# Patient Record
Sex: Female | Born: 1945
Health system: Southern US, Community
[De-identification: ages and names within clinical notes are randomized; demographics above are authoritative.]

## PROBLEM LIST (undated history)

## (undated) DIAGNOSIS — M1990 Unspecified osteoarthritis, unspecified site: Secondary | ICD-10-CM

## (undated) DIAGNOSIS — N189 Chronic kidney disease, unspecified: Secondary | ICD-10-CM

## (undated) DIAGNOSIS — M810 Age-related osteoporosis without current pathological fracture: Secondary | ICD-10-CM

## (undated) DIAGNOSIS — I499 Cardiac arrhythmia, unspecified: Secondary | ICD-10-CM

## (undated) DIAGNOSIS — A048 Other specified bacterial intestinal infections: Secondary | ICD-10-CM

## (undated) DIAGNOSIS — E7800 Pure hypercholesterolemia, unspecified: Secondary | ICD-10-CM

## (undated) DIAGNOSIS — T753XXA Motion sickness, initial encounter: Secondary | ICD-10-CM

## (undated) DIAGNOSIS — L719 Rosacea, unspecified: Secondary | ICD-10-CM

## (undated) DIAGNOSIS — K219 Gastro-esophageal reflux disease without esophagitis: Secondary | ICD-10-CM

## (undated) DIAGNOSIS — R7302 Impaired glucose tolerance (oral): Secondary | ICD-10-CM

## (undated) DIAGNOSIS — Z972 Presence of dental prosthetic device (complete) (partial): Secondary | ICD-10-CM

## (undated) HISTORY — PX: ABDOMINAL HYSTERECTOMY: SHX81

## (undated) HISTORY — PX: BILATERAL CARPAL TUNNEL RELEASE: SHX6508

## (undated) HISTORY — PX: EYE SURGERY: SHX253

## (undated) HISTORY — PX: CHOLECYSTECTOMY: SHX55

---

## 2004-07-03 ENCOUNTER — Ambulatory Visit: Payer: Self-pay | Admitting: Gastroenterology

## 2005-02-24 ENCOUNTER — Ambulatory Visit: Payer: Self-pay | Admitting: Unknown Physician Specialty

## 2006-04-17 ENCOUNTER — Ambulatory Visit: Payer: Self-pay | Admitting: Unknown Physician Specialty

## 2007-04-29 ENCOUNTER — Ambulatory Visit: Payer: Self-pay | Admitting: Unknown Physician Specialty

## 2008-05-18 ENCOUNTER — Ambulatory Visit: Payer: Self-pay | Admitting: Unknown Physician Specialty

## 2009-05-25 ENCOUNTER — Ambulatory Visit: Payer: Self-pay | Admitting: Unknown Physician Specialty

## 2010-05-31 ENCOUNTER — Ambulatory Visit: Payer: Self-pay | Admitting: Unknown Physician Specialty

## 2010-06-04 ENCOUNTER — Ambulatory Visit: Payer: Self-pay | Admitting: Gastroenterology

## 2011-06-03 ENCOUNTER — Ambulatory Visit: Payer: Self-pay | Admitting: Unknown Physician Specialty

## 2012-06-03 ENCOUNTER — Ambulatory Visit: Payer: Self-pay | Admitting: Unknown Physician Specialty

## 2013-06-23 ENCOUNTER — Ambulatory Visit: Payer: Self-pay | Admitting: Internal Medicine

## 2014-08-02 ENCOUNTER — Ambulatory Visit: Payer: Self-pay | Admitting: Internal Medicine

## 2015-08-06 ENCOUNTER — Other Ambulatory Visit: Payer: Self-pay | Admitting: Internal Medicine

## 2015-08-06 DIAGNOSIS — Z1239 Encounter for other screening for malignant neoplasm of breast: Secondary | ICD-10-CM

## 2015-08-16 ENCOUNTER — Ambulatory Visit
Admission: RE | Admit: 2015-08-16 | Discharge: 2015-08-16 | Disposition: A | Discharge status: Home / Self Care | Payer: PPO | Source: Ambulatory Visit | Attending: Internal Medicine | Admitting: Internal Medicine

## 2015-08-16 DIAGNOSIS — Z1231 Encounter for screening mammogram for malignant neoplasm of breast: Secondary | ICD-10-CM | POA: Diagnosis not present

## 2015-08-16 DIAGNOSIS — Z1239 Encounter for other screening for malignant neoplasm of breast: Secondary | ICD-10-CM

## 2015-09-17 ENCOUNTER — Encounter: Payer: Self-pay | Admitting: *Deleted

## 2015-09-24 NOTE — Discharge Instructions (Signed)

## 2015-09-25 ENCOUNTER — Ambulatory Visit: Payer: PPO | Admitting: Anesthesiology

## 2015-09-25 ENCOUNTER — Encounter
Admission: RE | Disposition: A | Discharge status: Home / Self Care | Payer: Self-pay | Source: Ambulatory Visit | Attending: Gastroenterology

## 2015-09-25 ENCOUNTER — Ambulatory Visit
Admission: RE | Admit: 2015-09-25 | Discharge: 2015-09-25 | Disposition: A | Discharge status: Home / Self Care | Payer: PPO | Source: Ambulatory Visit | Attending: Gastroenterology | Admitting: Gastroenterology

## 2015-09-25 DIAGNOSIS — M1990 Unspecified osteoarthritis, unspecified site: Secondary | ICD-10-CM | POA: Insufficient documentation

## 2015-09-25 DIAGNOSIS — Z8371 Family history of colonic polyps: Secondary | ICD-10-CM | POA: Insufficient documentation

## 2015-09-25 DIAGNOSIS — Z87891 Personal history of nicotine dependence: Secondary | ICD-10-CM | POA: Diagnosis not present

## 2015-09-25 DIAGNOSIS — Z90710 Acquired absence of both cervix and uterus: Secondary | ICD-10-CM | POA: Insufficient documentation

## 2015-09-25 DIAGNOSIS — E7800 Pure hypercholesterolemia, unspecified: Secondary | ICD-10-CM | POA: Insufficient documentation

## 2015-09-25 DIAGNOSIS — Z1211 Encounter for screening for malignant neoplasm of colon: Secondary | ICD-10-CM | POA: Diagnosis not present

## 2015-09-25 DIAGNOSIS — Z7982 Long term (current) use of aspirin: Secondary | ICD-10-CM | POA: Insufficient documentation

## 2015-09-25 DIAGNOSIS — Z800 Family history of malignant neoplasm of digestive organs: Secondary | ICD-10-CM | POA: Insufficient documentation

## 2015-09-25 DIAGNOSIS — M810 Age-related osteoporosis without current pathological fracture: Secondary | ICD-10-CM | POA: Insufficient documentation

## 2015-09-25 DIAGNOSIS — E785 Hyperlipidemia, unspecified: Secondary | ICD-10-CM | POA: Diagnosis not present

## 2015-09-25 DIAGNOSIS — Z9049 Acquired absence of other specified parts of digestive tract: Secondary | ICD-10-CM | POA: Diagnosis not present

## 2015-09-25 HISTORY — DX: Presence of dental prosthetic device (complete) (partial): Z97.2

## 2015-09-25 HISTORY — DX: Motion sickness, initial encounter: T75.3XXA

## 2015-09-25 HISTORY — DX: Unspecified osteoarthritis, unspecified site: M19.90

## 2015-09-25 HISTORY — DX: Pure hypercholesterolemia, unspecified: E78.00

## 2015-09-25 HISTORY — PX: COLONOSCOPY: SHX5424

## 2015-09-25 SURGERY — COLONOSCOPY
Anesthesia: Monitor Anesthesia Care | Wound class: Contaminated

## 2015-09-25 MED ORDER — OXYCODONE HCL 5 MG/5ML PO SOLN: 5.0000 mg | Freq: Once | ORAL | Status: DC | PRN

## 2015-09-25 MED ORDER — DEXAMETHASONE SODIUM PHOSPHATE 4 MG/ML IJ SOLN: 8.0000 mg | Freq: Once | INTRAMUSCULAR | Status: DC | PRN

## 2015-09-25 MED ORDER — ACETAMINOPHEN 325 MG PO TABS: 325.0000 mg | ORAL_TABLET | ORAL | Status: DC | PRN

## 2015-09-25 MED ORDER — SODIUM CHLORIDE 0.9 % IV SOLN: INTRAVENOUS | Status: DC

## 2015-09-25 MED ORDER — LACTATED RINGERS IV SOLN: 500.0000 mL | INTRAVENOUS | Status: DC

## 2015-09-25 MED ORDER — PROPOFOL 10 MG/ML IV BOLUS
INTRAVENOUS | Status: DC | PRN
  Administered 2015-09-25 (×5): 50 mg via INTRAVENOUS

## 2015-09-25 MED ORDER — ACETAMINOPHEN 160 MG/5ML PO SOLN: 325.0000 mg | ORAL | Status: DC | PRN

## 2015-09-25 MED ORDER — LACTATED RINGERS IV SOLN
INTRAVENOUS | Status: DC
  Administered 2015-09-25: 09:00:00 via INTRAVENOUS

## 2015-09-25 MED ORDER — OXYCODONE HCL 5 MG PO TABS: 5.0000 mg | ORAL_TABLET | Freq: Once | ORAL | Status: DC | PRN

## 2015-09-25 MED ORDER — LIDOCAINE HCL (CARDIAC) 20 MG/ML IV SOLN
INTRAVENOUS | Status: DC | PRN
  Administered 2015-09-25: 50 mg via INTRAVENOUS

## 2015-09-25 MED ORDER — FENTANYL CITRATE (PF) 100 MCG/2ML IJ SOLN: 25.0000 ug | INTRAMUSCULAR | Status: DC | PRN

## 2015-09-25 MED ORDER — STERILE WATER FOR IRRIGATION IR SOLN
Status: DC | PRN
  Administered 2015-09-25: 10:00:00

## 2015-09-25 SURGICAL SUPPLY — 30 items

## 2015-09-25 NOTE — Op Note (Signed)
[facility] Gastroenterology Patient Name: Maria Garcia Procedure Date: 09/25/2015 9:44 AM MRN: [id number] Account #: [id number] Date of Birth: [birth year] Admit Type: Outpatient Age: 70 Room: [facility] OR ROOM 01 Gender: Female Note Status: Finalized Procedure:         Colonoscopy Indications:       Colon cancer screening in patient at increased risk:                     Family history of 1st-degree relative with colon polyps,                     Screening in patient at increased risk: Family history of                     1st-degree relative with colorectal cancer Providers:         Lupita Dawn. Candace Cruise, MD Referring MD:      Glendon Axe (Referring MD) Medicines:         Monitored Anesthesia Care Complications:     No immediate complications. Procedure:         Pre-Anesthesia Assessment:                    - Prior to the procedure, a History and Physical was                     performed, and patient medications, allergies and                     sensitivities were reviewed. The patient's tolerance of                     previous anesthesia was reviewed.                    - The risks and benefits of the procedure and the sedation                     options and risks were discussed with the patient. All                     questions were answered and informed consent was obtained.                    - After reviewing the risks and benefits, the patient was                     deemed in satisfactory condition to undergo the procedure.                    After obtaining informed consent, the colonoscope was                     passed under direct vision. Throughout the procedure, the                     patient's blood pressure, pulse, and oxygen saturations                     were monitored continuously. The Olympus CF H180AL                     colonoscope (S#: [id number]) was introduced through the anus  and advanced to the the cecum, identified by  appendiceal                     orifice and ileocecal valve. The colonoscopy was performed                     without difficulty. The patient tolerated the procedure                     well. The quality of the bowel preparation was good. Findings:      The colon (entire examined portion) appeared normal. Impression:        - The entire examined colon is normal.                    - No specimens collected. Recommendation:    - Discharge patient to home.                    - Repeat colonoscopy in 5 years for surveillance.                    - The findings and recommendations were discussed with the                     patient. Procedure Code(s): --- Professional ---                    [phone], Colonoscopy, flexible; diagnostic, including                     collection of specimen(s) by brushing or washing, when                     performed (separate procedure) Diagnosis Code(s): --- Professional ---                    Z83.71, Family history of colonic polyps                    Z80.0, Family history of malignant neoplasm of digestive                     organs CPT copyright 2014 American Medical Association. All rights reserved. The codes documented in this report are preliminary and upon coder review may  be revised to meet current compliance requirements. Hulen Luster, MD 09/25/2015 10:06:52 AM This report has been signed electronically. Number of Addenda: 0 Note Initiated On: 09/25/2015 9:44 AM Scope Withdrawal Time: 0 hours 5 minutes 43 seconds  Total Procedure Duration: 0 hours 9 minutes 44 seconds       [facility]

## 2015-09-25 NOTE — Anesthesia Procedure Notes (Signed)
Procedure Name: MAC Performed by: Levar Fayson Pre-anesthesia Checklist: Patient identified, Emergency Drugs available, Suction available, Timeout performed and Patient being monitored Patient Re-evaluated:Patient Re-evaluated prior to inductionOxygen Delivery Method: Nasal cannula Placement Confirmation: positive ETCO2     

## 2015-09-25 NOTE — Transfer of Care (Signed)
Immediate Anesthesia Transfer of Care Note  Patient: Maria Garcia  Procedure(s) Performed: Procedure(s): COLONOSCOPY (N/A)  Patient Location: PACU  Anesthesia Type: MAC  Level of Consciousness: awake, alert  and patient cooperative  Airway and Oxygen Therapy: Patient Spontanous Breathing and Patient connected to supplemental oxygen  Post-op Assessment: Post-op Vital signs reviewed, Patient's Cardiovascular Status Stable, Respiratory Function Stable, Patent Airway and No signs of Nausea or vomiting  Post-op Vital Signs: Reviewed and stable  Complications: No apparent anesthesia complications

## 2015-09-25 NOTE — Anesthesia Postprocedure Evaluation (Signed)
Anesthesia Post Note  Patient: Maria Garcia  Procedure(s) Performed: Procedure(s) (LRB): COLONOSCOPY (N/A)  Patient location during evaluation: PACU Anesthesia Type: General Level of consciousness: awake and alert Pain management: pain level controlled Vital Signs Assessment: post-procedure vital signs reviewed and stable Respiratory status: spontaneous breathing, nonlabored ventilation and respiratory function stable Cardiovascular status: blood pressure returned to baseline and stable Postop Assessment: no signs of nausea or vomiting Anesthetic complications: no    Shellia Hartl D Sire Poet

## 2015-09-25 NOTE — Anesthesia Preprocedure Evaluation (Addendum)
Anesthesia Evaluation  Patient identified by MRN, date of birth, ID band Patient awake    Reviewed: Allergy & Precautions, H&P , NPO status , Patient's Chart, lab work & pertinent test results, reviewed documented beta blocker date and time   Airway Mallampati: II  TM Distance: >3 FB Neck ROM: full    Dental  (+) Partial Upper   Pulmonary neg pulmonary ROS, former smoker,    Pulmonary exam normal breath sounds clear to auscultation       Cardiovascular Exercise Tolerance: Good negative cardio ROS   Rhythm:regular Rate:Normal     Neuro/Psych negative neurological ROS  negative psych ROS   GI/Hepatic negative GI ROS, Neg liver ROS,   Endo/Other  negative endocrine ROS  Renal/GU negative Renal ROS  negative genitourinary   Musculoskeletal   Abdominal   Peds  Hematology negative hematology ROS (+)   Anesthesia Other Findings   Reproductive/Obstetrics negative OB ROS                            Anesthesia Physical Anesthesia Plan  ASA: II  Anesthesia Plan: MAC   Post-op Pain Management:    Induction:   Airway Management Planned:   Additional Equipment:   Intra-op Plan:   Post-operative Plan:   Informed Consent: I have reviewed the patients History and Physical, chart, labs and discussed the procedure including the risks, benefits and alternatives for the proposed anesthesia with the patient or authorized representative who has indicated his/her understanding and acceptance.     Plan Discussed with: CRNA  Anesthesia Plan Comments:        Anesthesia Quick Evaluation

## 2015-09-25 NOTE — H&P (Signed)
    Primary Care Physician:  Glendon Axe, MD Primary Gastroenterologist:  Dr. Candace Cruise  Pre-Procedure History & Physical: HPI:  Maria Garcia is a 70 y.o. female is here for an colonoscopy.   Past Medical History  Diagnosis Date  . Motion sickness     boats  . Arthritis   . Hypercholesteremia   . Wears dentures     partial upper    Past Surgical History  Procedure Laterality Date  . Abdominal hysterectomy    . Bilateral carpal tunnel release    . Cholecystectomy      Prior to Admission medications   Medication Sig Start Date End Date Taking? Authorizing Provider  aspirin 81 MG tablet Take 81 mg by mouth daily.   Yes Historical Provider, MD  CALCIUM PO Take by mouth.   Yes Historical Provider, MD  Glucosamine-Chondroitin (OSTEO BI-FLEX REGULAR STRENGTH PO) Take by mouth.   Yes Historical Provider, MD  MELOXICAM PO Take by mouth as needed.   Yes Historical Provider, MD  Multiple Vitamin (MULTIVITAMIN) tablet Take 1 tablet by mouth daily.   Yes Historical Provider, MD  raloxifene (EVISTA) 60 MG tablet Take 60 mg by mouth daily.   Yes Historical Provider, MD  simvastatin (ZOCOR) 20 MG tablet Take 20 mg by mouth daily.   Yes Historical Provider, MD    Allergies as of 08/31/2015  . (Not on File)    Family History  Problem Relation Age of Onset  . Breast cancer Cousin     Social History   Social History  . Marital Status: Married    Spouse Name: N/A  . Number of Children: N/A  . Years of Education: N/A   Occupational History  . Not on file.   Social History Main Topics  . Smoking status: Former Research scientist (life sciences)  . Smokeless tobacco: Not on file     Comment: quit 40+ yrs ago  . Alcohol Use: 1.8 oz/week    3 Glasses of wine per week  . Drug Use: Not on file  . Sexual Activity: Not on file   Other Topics Concern  . Not on file   Social History Narrative    Review of Systems: See HPI, otherwise negative ROS  Physical Exam: BP 111/74 mmHg  Pulse 65  Temp(Src)  97.7 F (36.5 C)  Resp 16  Ht 5\' 4"  (1.626 m)  Wt 71.215 kg (157 lb)  BMI 26.94 kg/m2  SpO2 96% General:   Alert,  pleasant and cooperative in NAD Head:  Normocephalic and atraumatic. Neck:  Supple; no masses or thyromegaly. Lungs:  Clear throughout to auscultation.    Heart:  Regular rate and rhythm. Abdomen:  Soft, nontender and nondistended. Normal bowel sounds, without guarding, and without rebound.   Neurologic:  Alert and  oriented x4;  grossly normal neurologically.  Impression/Plan: MARLETTE NJOKU is here for an colonoscopy to be performed for family hx of colon cancer/polyps. Risks, benefits, limitations, and alternatives regarding colonoscopy have been reviewed with the patient.  Questions have been answered.  All parties agreeable.   Alfie Alderfer, Lupita Dawn, MD  09/25/2015, 9:07 AM

## 2015-09-26 ENCOUNTER — Encounter: Payer: Self-pay | Admitting: Gastroenterology

## 2016-01-01 DIAGNOSIS — H2513 Age-related nuclear cataract, bilateral: Secondary | ICD-10-CM | POA: Diagnosis not present

## 2016-01-25 DIAGNOSIS — Z0000 Encounter for general adult medical examination without abnormal findings: Secondary | ICD-10-CM | POA: Diagnosis not present

## 2016-02-01 DIAGNOSIS — M1712 Unilateral primary osteoarthritis, left knee: Secondary | ICD-10-CM | POA: Diagnosis not present

## 2016-02-01 DIAGNOSIS — Z131 Encounter for screening for diabetes mellitus: Secondary | ICD-10-CM | POA: Diagnosis not present

## 2016-02-01 DIAGNOSIS — E7800 Pure hypercholesterolemia, unspecified: Secondary | ICD-10-CM | POA: Diagnosis not present

## 2016-02-01 DIAGNOSIS — Z8639 Personal history of other endocrine, nutritional and metabolic disease: Secondary | ICD-10-CM | POA: Diagnosis not present

## 2016-02-01 DIAGNOSIS — Z1159 Encounter for screening for other viral diseases: Secondary | ICD-10-CM | POA: Diagnosis not present

## 2016-02-01 DIAGNOSIS — N183 Chronic kidney disease, stage 3 (moderate): Secondary | ICD-10-CM | POA: Diagnosis not present

## 2016-05-20 DIAGNOSIS — Z23 Encounter for immunization: Secondary | ICD-10-CM | POA: Diagnosis not present

## 2016-07-29 DIAGNOSIS — Z1159 Encounter for screening for other viral diseases: Secondary | ICD-10-CM | POA: Diagnosis not present

## 2016-07-29 DIAGNOSIS — Z8639 Personal history of other endocrine, nutritional and metabolic disease: Secondary | ICD-10-CM | POA: Diagnosis not present

## 2016-07-29 DIAGNOSIS — Z131 Encounter for screening for diabetes mellitus: Secondary | ICD-10-CM | POA: Diagnosis not present

## 2016-07-29 DIAGNOSIS — E7800 Pure hypercholesterolemia, unspecified: Secondary | ICD-10-CM | POA: Diagnosis not present

## 2016-08-19 ENCOUNTER — Other Ambulatory Visit: Payer: Self-pay | Admitting: Internal Medicine

## 2016-08-19 DIAGNOSIS — Z23 Encounter for immunization: Secondary | ICD-10-CM | POA: Diagnosis not present

## 2016-08-19 DIAGNOSIS — Z1231 Encounter for screening mammogram for malignant neoplasm of breast: Secondary | ICD-10-CM | POA: Diagnosis not present

## 2016-08-19 DIAGNOSIS — Z0000 Encounter for general adult medical examination without abnormal findings: Secondary | ICD-10-CM | POA: Diagnosis not present

## 2016-08-19 DIAGNOSIS — Z1239 Encounter for other screening for malignant neoplasm of breast: Secondary | ICD-10-CM

## 2016-08-19 DIAGNOSIS — N183 Chronic kidney disease, stage 3 (moderate): Secondary | ICD-10-CM | POA: Diagnosis not present

## 2016-09-08 ENCOUNTER — Ambulatory Visit
Admission: RE | Admit: 2016-09-08 | Discharge: 2016-09-08 | Disposition: A | Discharge status: Home / Self Care | Payer: PPO | Source: Ambulatory Visit | Attending: Internal Medicine | Admitting: Internal Medicine

## 2016-09-08 DIAGNOSIS — Z1239 Encounter for other screening for malignant neoplasm of breast: Secondary | ICD-10-CM

## 2016-09-08 DIAGNOSIS — Z1231 Encounter for screening mammogram for malignant neoplasm of breast: Secondary | ICD-10-CM | POA: Insufficient documentation

## 2017-02-11 DIAGNOSIS — N183 Chronic kidney disease, stage 3 (moderate): Secondary | ICD-10-CM | POA: Diagnosis not present

## 2017-02-18 DIAGNOSIS — Z23 Encounter for immunization: Secondary | ICD-10-CM | POA: Diagnosis not present

## 2017-02-18 DIAGNOSIS — N183 Chronic kidney disease, stage 3 (moderate): Secondary | ICD-10-CM | POA: Diagnosis not present

## 2017-02-18 DIAGNOSIS — E7800 Pure hypercholesterolemia, unspecified: Secondary | ICD-10-CM | POA: Diagnosis not present

## 2017-02-18 DIAGNOSIS — G8929 Other chronic pain: Secondary | ICD-10-CM | POA: Diagnosis not present

## 2017-02-18 DIAGNOSIS — M546 Pain in thoracic spine: Secondary | ICD-10-CM | POA: Diagnosis not present

## 2017-02-18 DIAGNOSIS — Z780 Asymptomatic menopausal state: Secondary | ICD-10-CM | POA: Diagnosis not present

## 2017-02-18 DIAGNOSIS — Z0000 Encounter for general adult medical examination without abnormal findings: Secondary | ICD-10-CM | POA: Diagnosis not present

## 2017-03-02 DIAGNOSIS — Z780 Asymptomatic menopausal state: Secondary | ICD-10-CM | POA: Diagnosis not present

## 2017-03-06 DIAGNOSIS — M5416 Radiculopathy, lumbar region: Secondary | ICD-10-CM | POA: Diagnosis not present

## 2017-05-08 DIAGNOSIS — M47816 Spondylosis without myelopathy or radiculopathy, lumbar region: Secondary | ICD-10-CM | POA: Diagnosis not present

## 2017-05-08 DIAGNOSIS — M546 Pain in thoracic spine: Secondary | ICD-10-CM | POA: Diagnosis not present

## 2017-05-15 DIAGNOSIS — N183 Chronic kidney disease, stage 3 (moderate): Secondary | ICD-10-CM | POA: Diagnosis not present

## 2017-05-22 DIAGNOSIS — N183 Chronic kidney disease, stage 3 (moderate): Secondary | ICD-10-CM | POA: Diagnosis not present

## 2017-05-22 DIAGNOSIS — G8929 Other chronic pain: Secondary | ICD-10-CM | POA: Diagnosis not present

## 2017-05-22 DIAGNOSIS — M546 Pain in thoracic spine: Secondary | ICD-10-CM | POA: Diagnosis not present

## 2017-05-22 DIAGNOSIS — R1010 Upper abdominal pain, unspecified: Secondary | ICD-10-CM | POA: Diagnosis not present

## 2017-05-22 DIAGNOSIS — Z23 Encounter for immunization: Secondary | ICD-10-CM | POA: Diagnosis not present

## 2017-05-22 DIAGNOSIS — M545 Low back pain: Secondary | ICD-10-CM | POA: Diagnosis not present

## 2017-06-02 ENCOUNTER — Other Ambulatory Visit: Payer: Self-pay | Admitting: Internal Medicine

## 2017-06-02 DIAGNOSIS — R1010 Upper abdominal pain, unspecified: Secondary | ICD-10-CM

## 2017-06-08 ENCOUNTER — Ambulatory Visit
Admission: RE | Admit: 2017-06-08 | Discharge: 2017-06-08 | Disposition: A | Discharge status: Home / Self Care | Payer: PPO | Source: Ambulatory Visit | Attending: Internal Medicine | Admitting: Internal Medicine

## 2017-06-08 DIAGNOSIS — M5135 Other intervertebral disc degeneration, thoracolumbar region: Secondary | ICD-10-CM | POA: Insufficient documentation

## 2017-06-08 DIAGNOSIS — K5710 Diverticulosis of small intestine without perforation or abscess without bleeding: Secondary | ICD-10-CM | POA: Diagnosis not present

## 2017-06-08 DIAGNOSIS — R109 Unspecified abdominal pain: Secondary | ICD-10-CM | POA: Diagnosis not present

## 2017-06-08 DIAGNOSIS — R1010 Upper abdominal pain, unspecified: Secondary | ICD-10-CM | POA: Diagnosis not present

## 2017-06-08 DIAGNOSIS — I700 Atherosclerosis of aorta: Secondary | ICD-10-CM | POA: Diagnosis not present

## 2017-06-08 MED ORDER — IOPAMIDOL (ISOVUE-300) INJECTION 61%
100.0000 mL | Freq: Once | INTRAVENOUS | Status: AC | PRN
  Administered 2017-06-08: 100 mL via INTRAVENOUS

## 2017-06-11 DIAGNOSIS — K5710 Diverticulosis of small intestine without perforation or abscess without bleeding: Secondary | ICD-10-CM | POA: Diagnosis not present

## 2017-06-11 DIAGNOSIS — R1013 Epigastric pain: Secondary | ICD-10-CM | POA: Diagnosis not present

## 2017-06-11 DIAGNOSIS — K219 Gastro-esophageal reflux disease without esophagitis: Secondary | ICD-10-CM | POA: Diagnosis not present

## 2017-06-11 DIAGNOSIS — R933 Abnormal findings on diagnostic imaging of other parts of digestive tract: Secondary | ICD-10-CM | POA: Diagnosis not present

## 2017-06-11 DIAGNOSIS — R072 Precordial pain: Secondary | ICD-10-CM | POA: Diagnosis not present

## 2017-06-16 DIAGNOSIS — N183 Chronic kidney disease, stage 3 (moderate): Secondary | ICD-10-CM | POA: Diagnosis not present

## 2017-06-16 DIAGNOSIS — R001 Bradycardia, unspecified: Secondary | ICD-10-CM | POA: Diagnosis not present

## 2017-06-16 DIAGNOSIS — E782 Mixed hyperlipidemia: Secondary | ICD-10-CM | POA: Diagnosis not present

## 2017-06-16 DIAGNOSIS — K210 Gastro-esophageal reflux disease with esophagitis: Secondary | ICD-10-CM | POA: Diagnosis not present

## 2017-06-16 DIAGNOSIS — Z01818 Encounter for other preprocedural examination: Secondary | ICD-10-CM | POA: Diagnosis not present

## 2017-06-16 DIAGNOSIS — R072 Precordial pain: Secondary | ICD-10-CM | POA: Diagnosis not present

## 2017-06-29 DIAGNOSIS — R072 Precordial pain: Secondary | ICD-10-CM | POA: Diagnosis not present

## 2017-07-06 DIAGNOSIS — E7800 Pure hypercholesterolemia, unspecified: Secondary | ICD-10-CM | POA: Diagnosis not present

## 2017-07-06 DIAGNOSIS — R001 Bradycardia, unspecified: Secondary | ICD-10-CM | POA: Diagnosis not present

## 2017-07-06 DIAGNOSIS — R072 Precordial pain: Secondary | ICD-10-CM | POA: Diagnosis not present

## 2017-07-06 DIAGNOSIS — E782 Mixed hyperlipidemia: Secondary | ICD-10-CM | POA: Diagnosis not present

## 2017-08-11 ENCOUNTER — Ambulatory Visit: Admission: RE | Admit: 2017-08-11 | Payer: PPO | Source: Ambulatory Visit | Admitting: Internal Medicine

## 2017-08-11 ENCOUNTER — Encounter: Admission: RE | Payer: Self-pay | Source: Ambulatory Visit

## 2017-08-11 SURGERY — ESOPHAGOGASTRODUODENOSCOPY (EGD) WITH PROPOFOL
Anesthesia: General

## 2017-08-17 ENCOUNTER — Encounter: Payer: Self-pay | Admitting: *Deleted

## 2017-08-18 ENCOUNTER — Ambulatory Visit
Admission: RE | Admit: 2017-08-18 | Discharge: 2017-08-18 | Disposition: A | Discharge status: Home / Self Care | Payer: PPO | Source: Ambulatory Visit | Attending: Internal Medicine | Admitting: Internal Medicine

## 2017-08-18 ENCOUNTER — Ambulatory Visit: Payer: PPO | Admitting: Anesthesiology

## 2017-08-18 ENCOUNTER — Encounter
Admission: RE | Disposition: A | Discharge status: Home / Self Care | Payer: Self-pay | Source: Ambulatory Visit | Attending: Internal Medicine

## 2017-08-18 DIAGNOSIS — K2970 Gastritis, unspecified, without bleeding: Secondary | ICD-10-CM | POA: Diagnosis not present

## 2017-08-18 DIAGNOSIS — R1013 Epigastric pain: Secondary | ICD-10-CM | POA: Diagnosis not present

## 2017-08-18 DIAGNOSIS — E7800 Pure hypercholesterolemia, unspecified: Secondary | ICD-10-CM | POA: Insufficient documentation

## 2017-08-18 DIAGNOSIS — Z791 Long term (current) use of non-steroidal anti-inflammatories (NSAID): Secondary | ICD-10-CM | POA: Insufficient documentation

## 2017-08-18 DIAGNOSIS — Z79810 Long term (current) use of selective estrogen receptor modulators (SERMs): Secondary | ICD-10-CM | POA: Insufficient documentation

## 2017-08-18 DIAGNOSIS — K449 Diaphragmatic hernia without obstruction or gangrene: Secondary | ICD-10-CM | POA: Insufficient documentation

## 2017-08-18 DIAGNOSIS — Z7982 Long term (current) use of aspirin: Secondary | ICD-10-CM | POA: Diagnosis not present

## 2017-08-18 DIAGNOSIS — Z87891 Personal history of nicotine dependence: Secondary | ICD-10-CM | POA: Diagnosis not present

## 2017-08-18 DIAGNOSIS — Z79899 Other long term (current) drug therapy: Secondary | ICD-10-CM | POA: Insufficient documentation

## 2017-08-18 HISTORY — DX: Age-related osteoporosis without current pathological fracture: M81.0

## 2017-08-18 HISTORY — PX: ESOPHAGOGASTRODUODENOSCOPY (EGD) WITH PROPOFOL: SHX5813

## 2017-08-18 HISTORY — DX: Unspecified osteoarthritis, unspecified site: M19.90

## 2017-08-18 HISTORY — DX: Rosacea, unspecified: L71.9

## 2017-08-18 SURGERY — ESOPHAGOGASTRODUODENOSCOPY (EGD) WITH PROPOFOL
Anesthesia: General

## 2017-08-18 MED ORDER — PROPOFOL 500 MG/50ML IV EMUL
INTRAVENOUS | Status: DC | PRN
  Administered 2017-08-18: 150 ug/kg/min via INTRAVENOUS

## 2017-08-18 MED ORDER — MIDAZOLAM HCL 2 MG/2ML IJ SOLN
INTRAMUSCULAR | Status: AC
  Filled 2017-08-18: qty 2

## 2017-08-18 MED ORDER — LIDOCAINE HCL (CARDIAC) 20 MG/ML IV SOLN
INTRAVENOUS | Status: DC | PRN
  Administered 2017-08-18: 50 mg via INTRAVENOUS

## 2017-08-18 MED ORDER — PROPOFOL 10 MG/ML IV BOLUS
INTRAVENOUS | Status: DC | PRN
  Administered 2017-08-18: 70 mg via INTRAVENOUS

## 2017-08-18 MED ORDER — SODIUM CHLORIDE 0.9 % IV SOLN
INTRAVENOUS | Status: DC
  Administered 2017-08-18: 14:00:00 via INTRAVENOUS

## 2017-08-18 MED ORDER — MIDAZOLAM HCL 2 MG/2ML IJ SOLN
INTRAMUSCULAR | Status: DC | PRN
  Administered 2017-08-18: 2 mg via INTRAVENOUS

## 2017-08-18 MED ORDER — LIDOCAINE HCL (PF) 2 % IJ SOLN
INTRAMUSCULAR | Status: AC
  Filled 2017-08-18: qty 10

## 2017-08-18 NOTE — Anesthesia Preprocedure Evaluation (Signed)
Anesthesia Evaluation  Patient identified by MRN, date of birth, ID band Patient awake    Reviewed: Allergy & Precautions, NPO status , Patient's Chart, lab work & pertinent test results  Airway Mallampati: II       Dental  (+) Upper Dentures, Lower Dentures   Pulmonary neg pulmonary ROS, former smoker,  Recent uri   breath sounds clear to auscultation       Cardiovascular Exercise Tolerance: Good  Rhythm:Regular Rate:Normal     Neuro/Psych negative neurological ROS  negative psych ROS   GI/Hepatic negative GI ROS, Neg liver ROS,   Endo/Other  negative endocrine ROS  Renal/GU negative Renal ROS  negative genitourinary   Musculoskeletal   Abdominal Normal abdominal exam  (+)   Peds negative pediatric ROS (+)  Hematology negative hematology ROS (+)   Anesthesia Other Findings   Reproductive/Obstetrics                             Anesthesia Physical Anesthesia Plan  ASA: II  Anesthesia Plan: General   Post-op Pain Management:    Induction: Intravenous  PONV Risk Score and Plan:   Airway Management Planned: Natural Airway and Nasal Cannula  Additional Equipment:   Intra-op Plan:   Post-operative Plan:   Informed Consent: I have reviewed the patients History and Physical, chart, labs and discussed the procedure including the risks, benefits and alternatives for the proposed anesthesia with the patient or authorized representative who has indicated his/her understanding and acceptance.     Plan Discussed with: CRNA  Anesthesia Plan Comments:         Anesthesia Quick Evaluation

## 2017-08-18 NOTE — Anesthesia Procedure Notes (Signed)
Performed by: Ariyanah Aguado, CRNA Pre-anesthesia Checklist: Patient identified, Emergency Drugs available, Suction available, Patient being monitored and Timeout performed Patient Re-evaluated:Patient Re-evaluated prior to induction Oxygen Delivery Method: Nasal cannula Induction Type: IV induction       

## 2017-08-18 NOTE — Interval H&P Note (Signed)
History and Physical Interval Note:  08/18/2017 2:38 PM  Maria Garcia  has presented today for surgery, with the diagnosis of epigastric pain abormal ct  The various methods of treatment have been discussed with the patient and family. After consideration of risks, benefits and other options for treatment, the patient has consented to  Procedure(s): ESOPHAGOGASTRODUODENOSCOPY (EGD) WITH PROPOFOL (N/A) as a surgical intervention .  The patient's history has been reviewed, patient examined, no change in status, stable for surgery.  I have reviewed the patient's chart and labs.  Questions were answered to the patient's satisfaction.     [place], [place]

## 2017-08-18 NOTE — H&P (Signed)
Outpatient short stay form Pre-procedure 08/18/2017 2:36 PM Mehek Grega K. Alice Reichert, M.D.  Primary Physician: Glendon Axe, M.D.  Reason for visit:  Epigastric pain  History of present illness:  71 y/o female presents for epigastric pain. She was found to be H Pylori POSITIVE on serologic testing and we treated with antibiotic therapy. Patient has no further epigastric pain.     Current Facility-Administered Medications:  .  0.9 %  sodium chloride infusion, , Intravenous, Continuous, [place], Benay Pike, MD, Last Rate: 20 mL/hr at 08/18/17 1413  Medications Prior to Admission  Medication Sig Dispense Refill Last Dose  . acetaminophen (TYLENOL) 650 MG CR tablet Take 650 mg by mouth every 8 (eight) hours as needed for pain.   08/17/2017 at Unknown time  . CALCIUM PO Take by mouth.   Past Week at Unknown time  . Glucosamine-Chondroitin (OSTEO BI-FLEX REGULAR STRENGTH PO) Take by mouth.   Past Month at Unknown time  . MELOXICAM PO Take by mouth as needed.   08/17/2017 at Unknown time  . Multiple Vitamin (MULTIVITAMIN) tablet Take 1 tablet by mouth daily.   Past Month at Unknown time  . pantoprazole (PROTONIX) 40 MG tablet Take 40 mg by mouth daily.   08/17/2017 at Unknown time  . raloxifene (EVISTA) 60 MG tablet Take 60 mg by mouth daily.   08/17/2017 at Unknown time  . simvastatin (ZOCOR) 20 MG tablet Take 20 mg by mouth daily.   08/17/2017 at Unknown time  . traMADol (ULTRAM) 50 MG tablet Take by mouth 2 (two) times daily.   Past Month at Unknown time  . aspirin 81 MG tablet Take 81 mg by mouth daily.   Not Taking at Unknown time  . tiZANidine (ZANAFLEX) 2 MG tablet Take 2 mg by mouth 3 (three) times daily.   Not Taking at Unknown time     No Known Allergies   Past Medical History:  Diagnosis Date  . Arthritis   . DJD (degenerative joint disease)   . Hypercholesteremia   . Motion sickness    boats  . Osteoporosis   . Rosacea   . Wears dentures    partial upper    Review of  systems:      Physical Exam  General appearance: alert, cooperative and appears stated age Resp: clear to auscultation bilaterally Cardio: regular rate and rhythm, S1, S2 normal, no murmur, click, rub or gallop GI: soft, non-tender; bowel sounds normal; no masses,  no organomegaly Extremities: extremities normal, atraumatic, no cyanosis or edema     Planned procedures: EGD. The patient understands the nature of the planned procedure, indications, risks, alternatives and potential complications including but not limited to bleeding, infection, perforation, damage to internal organs and possible oversedation/side effects from anesthesia. The patient agrees and gives consent to proceed.  Please refer to procedure notes for findings, recommendations and patient disposition/instructions.    Lyfe Monger K. Alice Reichert, M.D. Gastroenterology 08/18/2017  2:36 PM

## 2017-08-18 NOTE — Transfer of Care (Signed)
Immediate Anesthesia Transfer of Care Note  Patient: Maria Garcia  Procedure(s) Performed: ESOPHAGOGASTRODUODENOSCOPY (EGD) WITH PROPOFOL (N/A )  Patient Location: PACU  Anesthesia Type:General  Level of Consciousness: sedated  Airway & Oxygen Therapy: Patient Spontanous Breathing and Patient connected to nasal cannula oxygen  Post-op Assessment: Report given to RN and Post -op Vital signs reviewed and stable  Post vital signs: Reviewed and stable  Last Vitals:  Vitals:   08/18/17 1353 08/18/17 1454  BP: 125/76 114/68  Pulse: 73 68  Resp: 18 (!) 21  Temp: (!) 36.3 C   SpO2: 99% 92%    Last Pain:  Vitals:   08/18/17 1353  TempSrc: Oral         Complications: No apparent anesthesia complications

## 2017-08-18 NOTE — Op Note (Signed)
[facility] Gastroenterology Patient Name: Maria Garcia Procedure Date: 08/18/2017 2:39 PM MRN: [id number] Account #: [id number] Date of Birth: December 10, 1945 Admit Type: Outpatient Age: 71 Room: [facility] ENDO ROOM 4 Gender: Female Note Status: Finalized Procedure:            Upper GI endoscopy Indications:          Epigastric abdominal pain Providers:            Benay Pike. Alice Reichert MD, MD Referring MD:         Glendon Axe (Referring MD) Medicines:            Propofol per Anesthesia Complications:        No immediate complications. Procedure:            Pre-Anesthesia Assessment:                       - The risks and benefits of the procedure and the                        sedation options and risks were discussed with the                        patient. All questions were answered and informed                        consent was obtained.                       - Patient identification and proposed procedure were                        verified prior to the procedure by the physician. The                        procedure was verified in the procedure room.                       - The risks and benefits of the procedure and the                        sedation options and risks were discussed with the                        patient. All questions were answered and informed                        consent was obtained.                       - Patient identification and proposed procedure were                        verified prior to the procedure by the nurse. The                        procedure was verified in the procedure room.                       - ASA Grade Assessment: III - A patient with severe  systemic disease.                       - After reviewing the risks and benefits, the patient                        was deemed in satisfactory condition to undergo the                        procedure.                       After obtaining  informed consent, the endoscope was                        passed under direct vision. Throughout the procedure,                        the patient's blood pressure, pulse, and oxygen                        saturations were monitored continuously. The Endoscope                        was introduced through the mouth, and advanced to the                        third part of duodenum. The upper GI endoscopy was                        accomplished without difficulty. The patient tolerated                        the procedure well. Findings:      The in the esophagus was normal.      A small hiatal hernia was present.      Localized mildly erythematous mucosa without bleeding was found in the       gastric antrum.      The examined duodenum was normal. Impression:           - Normal.                       - Small hiatal hernia.                       - Erythematous mucosa in the antrum.                       - Normal examined duodenum.                       - No specimens collected. Recommendation:       - Patient has a contact number available for                        emergencies. The signs and symptoms of potential                        delayed complications were discussed with the patient.                        Return  to normal activities tomorrow. Written discharge                        instructions were provided to the patient.                       - Resume previous diet.                       - Continue present medications.                       - No repeat upper endoscopy.                       - Return to GI office PRN. Procedure Code(s):    --- Professional ---                       [phone], Esophagogastroduodenoscopy, flexible, transoral;                        diagnostic, including collection of specimen(s) by                        brushing or washing, when performed (separate procedure) Diagnosis Code(s):    --- Professional ---                       R10.13, Epigastric  pain                       K31.89, Other diseases of stomach and duodenum                       K44.9, Diaphragmatic hernia without obstruction or                        gangrene CPT copyright 2016 American Medical Association. All rights reserved. The codes documented in this report are preliminary and upon coder review may  be revised to meet current compliance requirements. Efrain Sella MD, MD 08/18/2017 2:53:37 PM This report has been signed electronically. Number of Addenda: 0 Note Initiated On: 08/18/2017 2:39 PM      [facility]

## 2017-08-18 NOTE — Anesthesia Post-op Follow-up Note (Signed)
Anesthesia QCDR form completed.        

## 2017-08-19 ENCOUNTER — Encounter: Payer: Self-pay | Admitting: Internal Medicine

## 2017-08-19 NOTE — Anesthesia Postprocedure Evaluation (Signed)
Anesthesia Post Note  Patient: Maria Garcia  Procedure(s) Performed: ESOPHAGOGASTRODUODENOSCOPY (EGD) WITH PROPOFOL (N/A )  Patient location during evaluation: PACU Anesthesia Type: General Level of consciousness: awake Pain management: pain level controlled Vital Signs Assessment: post-procedure vital signs reviewed and stable Respiratory status: spontaneous breathing Cardiovascular status: stable Anesthetic complications: no     Last Vitals:  Vitals:   08/18/17 1514 08/18/17 1524  BP: 129/75 136/86  Pulse: 61 63  Resp: 20 16  Temp:    SpO2: 99% 97%    Last Pain:  Vitals:   08/18/17 1454  TempSrc: Tympanic                 VAN STAVEREN,Cyndee Giammarco

## 2017-09-23 ENCOUNTER — Ambulatory Visit: Admission: RE | Admit: 2017-09-23 | Payer: PPO | Source: Ambulatory Visit | Admitting: Internal Medicine

## 2017-09-23 ENCOUNTER — Encounter: Admission: RE | Payer: Self-pay | Source: Ambulatory Visit

## 2017-09-23 SURGERY — ESOPHAGOGASTRODUODENOSCOPY (EGD) WITH PROPOFOL
Anesthesia: General

## 2017-10-06 DIAGNOSIS — N183 Chronic kidney disease, stage 3 (moderate): Secondary | ICD-10-CM | POA: Diagnosis not present

## 2017-10-06 DIAGNOSIS — E7800 Pure hypercholesterolemia, unspecified: Secondary | ICD-10-CM | POA: Diagnosis not present

## 2017-10-13 ENCOUNTER — Other Ambulatory Visit: Payer: Self-pay | Admitting: Internal Medicine

## 2017-10-13 DIAGNOSIS — Z0000 Encounter for general adult medical examination without abnormal findings: Secondary | ICD-10-CM | POA: Diagnosis not present

## 2017-10-13 DIAGNOSIS — N183 Chronic kidney disease, stage 3 (moderate): Secondary | ICD-10-CM | POA: Diagnosis not present

## 2017-10-13 DIAGNOSIS — Z1231 Encounter for screening mammogram for malignant neoplasm of breast: Secondary | ICD-10-CM

## 2017-10-21 ENCOUNTER — Ambulatory Visit
Admission: RE | Admit: 2017-10-21 | Discharge: 2017-10-21 | Disposition: A | Discharge status: Home / Self Care | Payer: PPO | Source: Ambulatory Visit | Attending: Internal Medicine | Admitting: Internal Medicine

## 2017-10-21 DIAGNOSIS — Z1231 Encounter for screening mammogram for malignant neoplasm of breast: Secondary | ICD-10-CM | POA: Insufficient documentation

## 2017-12-01 DIAGNOSIS — Z79899 Other long term (current) drug therapy: Secondary | ICD-10-CM | POA: Diagnosis not present

## 2017-12-01 DIAGNOSIS — M546 Pain in thoracic spine: Secondary | ICD-10-CM | POA: Diagnosis not present

## 2017-12-01 DIAGNOSIS — G8929 Other chronic pain: Secondary | ICD-10-CM | POA: Diagnosis not present

## 2017-12-01 DIAGNOSIS — Z7689 Persons encountering health services in other specified circumstances: Secondary | ICD-10-CM | POA: Diagnosis not present

## 2017-12-01 DIAGNOSIS — M545 Low back pain: Secondary | ICD-10-CM | POA: Diagnosis not present

## 2017-12-01 DIAGNOSIS — K219 Gastro-esophageal reflux disease without esophagitis: Secondary | ICD-10-CM | POA: Diagnosis not present

## 2018-01-14 DIAGNOSIS — N183 Chronic kidney disease, stage 3 (moderate): Secondary | ICD-10-CM | POA: Diagnosis not present

## 2018-01-14 DIAGNOSIS — E782 Mixed hyperlipidemia: Secondary | ICD-10-CM | POA: Diagnosis not present

## 2018-03-02 DIAGNOSIS — G8929 Other chronic pain: Secondary | ICD-10-CM | POA: Diagnosis not present

## 2018-03-02 DIAGNOSIS — N183 Chronic kidney disease, stage 3 (moderate): Secondary | ICD-10-CM | POA: Diagnosis not present

## 2018-03-02 DIAGNOSIS — F1190 Opioid use, unspecified, uncomplicated: Secondary | ICD-10-CM | POA: Diagnosis not present

## 2018-03-02 DIAGNOSIS — M546 Pain in thoracic spine: Secondary | ICD-10-CM | POA: Diagnosis not present

## 2018-03-02 DIAGNOSIS — M545 Low back pain: Secondary | ICD-10-CM | POA: Diagnosis not present

## 2018-03-02 DIAGNOSIS — E782 Mixed hyperlipidemia: Secondary | ICD-10-CM | POA: Diagnosis not present

## 2018-03-02 DIAGNOSIS — K219 Gastro-esophageal reflux disease without esophagitis: Secondary | ICD-10-CM | POA: Diagnosis not present

## 2018-03-02 DIAGNOSIS — Z79899 Other long term (current) drug therapy: Secondary | ICD-10-CM | POA: Diagnosis not present

## 2018-06-02 DIAGNOSIS — Z23 Encounter for immunization: Secondary | ICD-10-CM | POA: Diagnosis not present

## 2018-06-02 DIAGNOSIS — K219 Gastro-esophageal reflux disease without esophagitis: Secondary | ICD-10-CM | POA: Diagnosis not present

## 2018-06-02 DIAGNOSIS — M546 Pain in thoracic spine: Secondary | ICD-10-CM | POA: Diagnosis not present

## 2018-06-02 DIAGNOSIS — M545 Low back pain: Secondary | ICD-10-CM | POA: Diagnosis not present

## 2018-06-02 DIAGNOSIS — E782 Mixed hyperlipidemia: Secondary | ICD-10-CM | POA: Diagnosis not present

## 2018-06-02 DIAGNOSIS — G8929 Other chronic pain: Secondary | ICD-10-CM | POA: Diagnosis not present

## 2018-06-02 DIAGNOSIS — Z79899 Other long term (current) drug therapy: Secondary | ICD-10-CM | POA: Diagnosis not present

## 2019-02-09 ENCOUNTER — Other Ambulatory Visit: Payer: Self-pay | Admitting: Nurse Practitioner

## 2019-02-09 DIAGNOSIS — Z79899 Other long term (current) drug therapy: Secondary | ICD-10-CM | POA: Diagnosis not present

## 2019-02-09 DIAGNOSIS — M546 Pain in thoracic spine: Secondary | ICD-10-CM | POA: Diagnosis not present

## 2019-02-09 DIAGNOSIS — K219 Gastro-esophageal reflux disease without esophagitis: Secondary | ICD-10-CM | POA: Diagnosis not present

## 2019-02-09 DIAGNOSIS — Z0000 Encounter for general adult medical examination without abnormal findings: Secondary | ICD-10-CM | POA: Diagnosis not present

## 2019-02-09 DIAGNOSIS — Z1231 Encounter for screening mammogram for malignant neoplasm of breast: Secondary | ICD-10-CM

## 2019-02-09 DIAGNOSIS — Z23 Encounter for immunization: Secondary | ICD-10-CM | POA: Diagnosis not present

## 2019-02-09 DIAGNOSIS — F1190 Opioid use, unspecified, uncomplicated: Secondary | ICD-10-CM | POA: Diagnosis not present

## 2019-02-09 DIAGNOSIS — G8929 Other chronic pain: Secondary | ICD-10-CM | POA: Diagnosis not present

## 2019-02-09 DIAGNOSIS — M545 Low back pain: Secondary | ICD-10-CM | POA: Diagnosis not present

## 2019-02-09 DIAGNOSIS — E782 Mixed hyperlipidemia: Secondary | ICD-10-CM | POA: Diagnosis not present

## 2019-02-09 DIAGNOSIS — N183 Chronic kidney disease, stage 3 (moderate): Secondary | ICD-10-CM | POA: Diagnosis not present

## 2019-02-17 ENCOUNTER — Ambulatory Visit
Admission: RE | Admit: 2019-02-17 | Discharge: 2019-02-17 | Disposition: A | Discharge status: Home / Self Care | Payer: PPO | Source: Ambulatory Visit | Attending: Nurse Practitioner | Admitting: Nurse Practitioner

## 2019-02-17 ENCOUNTER — Other Ambulatory Visit: Payer: Self-pay

## 2019-02-17 DIAGNOSIS — Z1231 Encounter for screening mammogram for malignant neoplasm of breast: Secondary | ICD-10-CM

## 2019-05-20 DIAGNOSIS — Z23 Encounter for immunization: Secondary | ICD-10-CM | POA: Diagnosis not present

## 2019-08-12 DIAGNOSIS — G8929 Other chronic pain: Secondary | ICD-10-CM | POA: Diagnosis not present

## 2019-08-12 DIAGNOSIS — E782 Mixed hyperlipidemia: Secondary | ICD-10-CM | POA: Diagnosis not present

## 2019-08-12 DIAGNOSIS — Z79899 Other long term (current) drug therapy: Secondary | ICD-10-CM | POA: Diagnosis not present

## 2019-08-12 DIAGNOSIS — K219 Gastro-esophageal reflux disease without esophagitis: Secondary | ICD-10-CM | POA: Diagnosis not present

## 2019-08-12 DIAGNOSIS — M545 Low back pain: Secondary | ICD-10-CM | POA: Diagnosis not present

## 2019-08-12 DIAGNOSIS — M546 Pain in thoracic spine: Secondary | ICD-10-CM | POA: Diagnosis not present

## 2019-10-21 DIAGNOSIS — H35372 Puckering of macula, left eye: Secondary | ICD-10-CM | POA: Diagnosis not present

## 2019-11-04 DIAGNOSIS — E7800 Pure hypercholesterolemia, unspecified: Secondary | ICD-10-CM | POA: Diagnosis not present

## 2019-11-04 DIAGNOSIS — H2512 Age-related nuclear cataract, left eye: Secondary | ICD-10-CM | POA: Diagnosis not present

## 2019-11-08 ENCOUNTER — Encounter: Payer: Self-pay | Admitting: Ophthalmology

## 2019-11-08 ENCOUNTER — Other Ambulatory Visit: Payer: Self-pay

## 2019-11-11 ENCOUNTER — Other Ambulatory Visit
Admission: RE | Admit: 2019-11-11 | Discharge: 2019-11-11 | Disposition: A | Discharge status: Home / Self Care | Payer: PPO | Source: Ambulatory Visit | Attending: Ophthalmology | Admitting: Ophthalmology

## 2019-11-11 DIAGNOSIS — Z20822 Contact with and (suspected) exposure to covid-19: Secondary | ICD-10-CM | POA: Diagnosis not present

## 2019-11-11 DIAGNOSIS — Z01812 Encounter for preprocedural laboratory examination: Secondary | ICD-10-CM | POA: Insufficient documentation

## 2019-11-11 LAB — SARS CORONAVIRUS 2 (TAT 6-24 HRS): SARS Coronavirus 2: NEGATIVE

## 2019-11-11 NOTE — Discharge Instructions (Signed)

## 2019-11-15 ENCOUNTER — Encounter
Admission: RE | Disposition: A | Discharge status: Home / Self Care | Payer: Self-pay | Source: Home / Self Care | Attending: Ophthalmology

## 2019-11-15 ENCOUNTER — Ambulatory Visit
Admission: RE | Admit: 2019-11-15 | Discharge: 2019-11-15 | Disposition: A | Discharge status: Home / Self Care | Payer: PPO | Attending: Ophthalmology | Admitting: Ophthalmology

## 2019-11-15 ENCOUNTER — Encounter: Payer: Self-pay | Admitting: Ophthalmology

## 2019-11-15 ENCOUNTER — Ambulatory Visit: Payer: PPO | Admitting: Anesthesiology

## 2019-11-15 ENCOUNTER — Other Ambulatory Visit: Payer: Self-pay

## 2019-11-15 DIAGNOSIS — E785 Hyperlipidemia, unspecified: Secondary | ICD-10-CM | POA: Insufficient documentation

## 2019-11-15 DIAGNOSIS — Z79899 Other long term (current) drug therapy: Secondary | ICD-10-CM | POA: Diagnosis not present

## 2019-11-15 DIAGNOSIS — H2512 Age-related nuclear cataract, left eye: Secondary | ICD-10-CM | POA: Diagnosis not present

## 2019-11-15 DIAGNOSIS — Z87891 Personal history of nicotine dependence: Secondary | ICD-10-CM | POA: Insufficient documentation

## 2019-11-15 DIAGNOSIS — H25812 Combined forms of age-related cataract, left eye: Secondary | ICD-10-CM | POA: Diagnosis not present

## 2019-11-15 DIAGNOSIS — M1990 Unspecified osteoarthritis, unspecified site: Secondary | ICD-10-CM | POA: Diagnosis not present

## 2019-11-15 HISTORY — PX: CATARACT EXTRACTION W/PHACO: SHX586

## 2019-11-15 SURGERY — PHACOEMULSIFICATION, CATARACT, WITH IOL INSERTION
Anesthesia: Monitor Anesthesia Care | Site: Eye | Laterality: Left

## 2019-11-15 MED ORDER — MIDAZOLAM HCL 2 MG/2ML IJ SOLN
INTRAMUSCULAR | Status: DC | PRN
  Administered 2019-11-15: 1.5 mg via INTRAVENOUS

## 2019-11-15 MED ORDER — FENTANYL CITRATE (PF) 100 MCG/2ML IJ SOLN
INTRAMUSCULAR | Status: DC | PRN
  Administered 2019-11-15: 50 ug via INTRAVENOUS

## 2019-11-15 MED ORDER — MOXIFLOXACIN HCL 0.5 % OP SOLN
OPHTHALMIC | Status: DC | PRN
  Administered 2019-11-15: 0.2 mL via OPHTHALMIC

## 2019-11-15 MED ORDER — NA CHONDROIT SULF-NA HYALURON 40-17 MG/ML IO SOLN
INTRAOCULAR | Status: DC | PRN
  Administered 2019-11-15: 1 mL via INTRAOCULAR

## 2019-11-15 MED ORDER — ARMC OPHTHALMIC DILATING DROPS
1.0000 "application " | OPHTHALMIC | Status: DC | PRN
  Administered 2019-11-15 (×3): 1 via OPHTHALMIC

## 2019-11-15 MED ORDER — EPINEPHRINE PF 1 MG/ML IJ SOLN
INTRAOCULAR | Status: DC | PRN
  Administered 2019-11-15: 12:00:00 46 mL via OPHTHALMIC

## 2019-11-15 MED ORDER — TETRACAINE HCL 0.5 % OP SOLN
1.0000 [drp] | OPHTHALMIC | Status: DC | PRN
  Administered 2019-11-15 (×3): 1 [drp] via OPHTHALMIC

## 2019-11-15 MED ORDER — BRIMONIDINE TARTRATE-TIMOLOL 0.2-0.5 % OP SOLN
OPHTHALMIC | Status: DC | PRN
  Administered 2019-11-15: 1 [drp] via OPHTHALMIC

## 2019-11-15 MED ORDER — LIDOCAINE HCL (PF) 2 % IJ SOLN
INTRAOCULAR | Status: DC | PRN
  Administered 2019-11-15: 2 mL

## 2019-11-15 SURGICAL SUPPLY — 21 items
CANNULA ANT/CHMB 27G (MISCELLANEOUS) ×2 IMPLANT
CANNULA ANT/CHMB 27GA (MISCELLANEOUS) ×6 IMPLANT
GLOVE SURG LX 8.0 MICRO (GLOVE) ×2
GLOVE SURG LX STRL 8.0 MICRO (GLOVE) ×1 IMPLANT
GLOVE SURG TRIUMPH 8.0 PF LTX (GLOVE) ×3 IMPLANT
GOWN STRL REUS W/ TWL LRG LVL3 (GOWN DISPOSABLE) ×2 IMPLANT
GOWN STRL REUS W/TWL LRG LVL3 (GOWN DISPOSABLE) ×4
LENS IOL TECNIS ITEC 21.0 (Intraocular Lens) ×2 IMPLANT
MARKER SKIN DUAL TIP RULER LAB (MISCELLANEOUS) ×3 IMPLANT
NDL FILTER BLUNT 18X1 1/2 (NEEDLE) ×1 IMPLANT
NDL RETROBULBAR .5 NSTRL (NEEDLE) ×3 IMPLANT
NEEDLE FILTER BLUNT 18X 1/2SAF (NEEDLE) ×2
NEEDLE FILTER BLUNT 18X1 1/2 (NEEDLE) ×1 IMPLANT
PACK EYE AFTER SURG (MISCELLANEOUS) ×3 IMPLANT
PACK OPTHALMIC (MISCELLANEOUS) ×3 IMPLANT
PACK PORFILIO (MISCELLANEOUS) ×3 IMPLANT
SYR 3ML LL SCALE MARK (SYRINGE) ×3 IMPLANT
SYR TB 1ML LUER SLIP (SYRINGE) ×3 IMPLANT
TIP ITREPID SGL USE BENT I/A (SUCTIONS) ×2 IMPLANT
WATER STERILE IRR 250ML POUR (IV SOLUTION) ×3 IMPLANT
WIPE NON LINTING 3.25X3.25 (MISCELLANEOUS) ×3 IMPLANT

## 2019-11-15 NOTE — Anesthesia Procedure Notes (Signed)
Procedure Name: MAC Performed by: Xin Klawitter, CRNA Pre-anesthesia Checklist: Patient identified, Emergency Drugs available, Suction available, Timeout performed and Patient being monitored Patient Re-evaluated:Patient Re-evaluated prior to induction Oxygen Delivery Method: Nasal cannula Placement Confirmation: positive ETCO2       

## 2019-11-15 NOTE — Transfer of Care (Signed)
Immediate Anesthesia Transfer of Care Note  Patient: Maria Garcia  Procedure(s) Performed: CATARACT EXTRACTION PHACO AND INTRAOCULAR LENS PLACEMENT (IOC) LEFT 7.20 00:40.0 (Left Eye)  Patient Location: PACU  Anesthesia Type: MAC  Level of Consciousness: awake, alert  and patient cooperative  Airway and Oxygen Therapy: Patient Spontanous Breathing and Patient connected to supplemental oxygen  Post-op Assessment: Post-op Vital signs reviewed, Patient's Cardiovascular Status Stable, Respiratory Function Stable, Patent Airway and No signs of Nausea or vomiting  Post-op Vital Signs: Reviewed and stable  Complications: No apparent anesthesia complications

## 2019-11-15 NOTE — Anesthesia Preprocedure Evaluation (Signed)
Anesthesia Evaluation  Patient identified by MRN, date of birth, ID band Patient awake    Reviewed: Allergy & Precautions, NPO status , Patient's Chart, lab work & pertinent test results  Airway Mallampati: II  TM Distance: >3 FB Neck ROM: Full    Dental  (+) Partial Upper   Pulmonary former smoker,    Pulmonary exam normal        Cardiovascular Normal cardiovascular exam  HLD   Neuro/Psych negative neurological ROS  negative psych ROS   GI/Hepatic negative GI ROS, Neg liver ROS,   Endo/Other  negative endocrine ROS  Renal/GU negative Renal ROS  negative genitourinary   Musculoskeletal  (+) Arthritis , Osteoarthritis,    Abdominal Normal abdominal exam  (+)   Peds negative pediatric ROS (+)  Hematology negative hematology ROS (+)   Anesthesia Other Findings   Reproductive/Obstetrics negative OB ROS                             Anesthesia Physical Anesthesia Plan  ASA: II  Anesthesia Plan: MAC   Post-op Pain Management:    Induction: Intravenous  PONV Risk Score and Plan: 2 and TIVA and Midazolam  Airway Management Planned: Natural Airway and Nasal Cannula  Additional Equipment:   Intra-op Plan:   Post-operative Plan:   Informed Consent: I have reviewed the patients History and Physical, chart, labs and discussed the procedure including the risks, benefits and alternatives for the proposed anesthesia with the patient or authorized representative who has indicated his/her understanding and acceptance.       Plan Discussed with: CRNA  Anesthesia Plan Comments:         Anesthesia Quick Evaluation

## 2019-11-15 NOTE — H&P (Signed)
All labs reviewed. Abnormal studies sent to patients PCP when indicated.  Previous H&P reviewed, patient examined, there are NO CHANGES.  Rohil Lesch Porfilio3/16/202111:10 AM

## 2019-11-15 NOTE — Op Note (Signed)
PREOPERATIVE DIAGNOSIS:  Nuclear sclerotic cataract of the left eye.   POSTOPERATIVE DIAGNOSIS:  Nuclear sclerotic cataract of the left eye.   OPERATIVE PROCEDURE:@   SURGEON:  Birder Robson, MD.   ANESTHESIA:  Anesthesiologist: Ardeth Sportsman, MD CRNA: Mayme Genta, CRNA  1.      Managed anesthesia care. 2.     0.90ml of Shugarcaine was instilled following the paracentesis   COMPLICATIONS:  None.   TECHNIQUE:   Stop and chop   DESCRIPTION OF PROCEDURE:  The patient was examined and consented in the preoperative holding area where the aforementioned topical anesthesia was applied to the left eye and then brought back to the Operating Room where the left eye was prepped and draped in the usual sterile ophthalmic fashion and a lid speculum was placed. A paracentesis was created with the side port blade and the anterior chamber was filled with viscoelastic. A near clear corneal incision was performed with the steel keratome. A continuous curvilinear capsulorrhexis was performed with a cystotome followed by the capsulorrhexis forceps. Hydrodissection and hydrodelineation were carried out with BSS on a blunt cannula. The lens was removed in a stop and chop  technique and the remaining cortical material was removed with the irrigation-aspiration handpiece. The capsular bag was inflated with viscoelastic and the Technis [id number] lens was placed in the capsular bag without complication. The remaining viscoelastic was removed from the eye with the irrigation-aspiration handpiece. The wounds were hydrated. The anterior chamber was flushed with BSS and the eye was inflated to physiologic pressure. 0.67ml Vigamox was placed in the anterior chamber. The wounds were found to be water tight. The eye was dressed with Combigan. The patient was given protective glasses to wear throughout the day and a shield with which to sleep tonight. The patient was also given drops with which to begin a drop regimen today and  will follow-up with me in one day. Implant Name Type Inv. Item Serial No. Manufacturer Lot No. LRB No. Used Action  LENS IOL DIOP 21.0 - [id number] Intraocular Lens LENS IOL DIOP 21.0 [id number]  Left 1 Implanted    Procedure(s) with comments: CATARACT EXTRACTION PHACO AND INTRAOCULAR LENS PLACEMENT (IOC) LEFT 7.20 00:40.0 (Left) - prefers early or last  Electronically signed: Birder Robson 11/15/2019 11:34 AM

## 2019-11-15 NOTE — Anesthesia Postprocedure Evaluation (Signed)
Anesthesia Post Note  Patient: Maria Garcia  Procedure(s) Performed: CATARACT EXTRACTION PHACO AND INTRAOCULAR LENS PLACEMENT (IOC) LEFT 7.20 00:40.0 (Left Eye)     Anesthesia Post Evaluation  Ardeth Sportsman

## 2019-11-16 ENCOUNTER — Encounter: Payer: Self-pay | Admitting: *Deleted

## 2019-11-24 DIAGNOSIS — E7800 Pure hypercholesterolemia, unspecified: Secondary | ICD-10-CM | POA: Diagnosis not present

## 2019-11-24 DIAGNOSIS — H2511 Age-related nuclear cataract, right eye: Secondary | ICD-10-CM | POA: Diagnosis not present

## 2019-12-01 ENCOUNTER — Other Ambulatory Visit: Payer: Self-pay

## 2019-12-01 ENCOUNTER — Encounter: Payer: Self-pay | Admitting: Ophthalmology

## 2019-12-08 NOTE — Anesthesia Preprocedure Evaluation (Addendum)
Anesthesia Evaluation  Patient identified by MRN, date of birth, ID band Patient awake    Reviewed: Allergy & Precautions, NPO status , Patient's Chart, lab work & pertinent test results, reviewed documented beta blocker date and time   History of Anesthesia Complications Negative for: history of anesthetic complications  Airway Mallampati: II  TM Distance: >3 FB Neck ROM: Full    Dental  (+) Partial Upper   Pulmonary former smoker,    Pulmonary exam normal breath sounds clear to auscultation       Cardiovascular (-) angina(-) DOE Normal cardiovascular exam Rhythm:Regular Rate:Normal  HLD   Neuro/Psych    GI/Hepatic neg GERD  ,  Endo/Other    Renal/GU      Musculoskeletal  (+) Arthritis , Osteoarthritis,    Abdominal Normal abdominal exam  (+)   Peds  Hematology   Anesthesia Other Findings   Reproductive/Obstetrics                            Anesthesia Physical  Anesthesia Plan  ASA: II  Anesthesia Plan: MAC   Post-op Pain Management:    Induction: Intravenous  PONV Risk Score and Plan: TIVA, Midazolam and Treatment may vary due to age or medical condition  Airway Management Planned: Nasal Cannula  Additional Equipment:   Intra-op Plan:   Post-operative Plan:   Informed Consent: I have reviewed the patients History and Physical, chart, labs and discussed the procedure including the risks, benefits and alternatives for the proposed anesthesia with the patient or authorized representative who has indicated his/her understanding and acceptance.       Plan Discussed with: CRNA and Anesthesiologist  Anesthesia Plan Comments:        Anesthesia Quick Evaluation

## 2019-12-09 ENCOUNTER — Other Ambulatory Visit
Admission: RE | Admit: 2019-12-09 | Discharge: 2019-12-09 | Disposition: A | Discharge status: Home / Self Care | Payer: PPO | Source: Ambulatory Visit | Attending: Ophthalmology | Admitting: Ophthalmology

## 2019-12-09 DIAGNOSIS — Z20822 Contact with and (suspected) exposure to covid-19: Secondary | ICD-10-CM | POA: Diagnosis not present

## 2019-12-09 DIAGNOSIS — Z01812 Encounter for preprocedural laboratory examination: Secondary | ICD-10-CM | POA: Diagnosis not present

## 2019-12-09 LAB — SARS CORONAVIRUS 2 (TAT 6-24 HRS): SARS Coronavirus 2: NEGATIVE

## 2019-12-12 NOTE — Discharge Instructions (Signed)

## 2019-12-13 ENCOUNTER — Ambulatory Visit: Payer: PPO | Admitting: Anesthesiology

## 2019-12-13 ENCOUNTER — Ambulatory Visit
Admission: RE | Admit: 2019-12-13 | Discharge: 2019-12-13 | Disposition: A | Discharge status: Home / Self Care | Payer: PPO | Attending: Ophthalmology | Admitting: Ophthalmology

## 2019-12-13 ENCOUNTER — Other Ambulatory Visit: Payer: Self-pay

## 2019-12-13 ENCOUNTER — Encounter: Payer: Self-pay | Admitting: Ophthalmology

## 2019-12-13 ENCOUNTER — Encounter
Admission: RE | Disposition: A | Discharge status: Home / Self Care | Payer: Self-pay | Source: Home / Self Care | Attending: Ophthalmology

## 2019-12-13 DIAGNOSIS — E785 Hyperlipidemia, unspecified: Secondary | ICD-10-CM | POA: Insufficient documentation

## 2019-12-13 DIAGNOSIS — M1990 Unspecified osteoarthritis, unspecified site: Secondary | ICD-10-CM | POA: Diagnosis not present

## 2019-12-13 DIAGNOSIS — H2511 Age-related nuclear cataract, right eye: Secondary | ICD-10-CM | POA: Insufficient documentation

## 2019-12-13 DIAGNOSIS — Z79899 Other long term (current) drug therapy: Secondary | ICD-10-CM | POA: Diagnosis not present

## 2019-12-13 DIAGNOSIS — H25811 Combined forms of age-related cataract, right eye: Secondary | ICD-10-CM | POA: Diagnosis not present

## 2019-12-13 DIAGNOSIS — Z87891 Personal history of nicotine dependence: Secondary | ICD-10-CM | POA: Diagnosis not present

## 2019-12-13 HISTORY — PX: CATARACT EXTRACTION W/PHACO: SHX586

## 2019-12-13 SURGERY — PHACOEMULSIFICATION, CATARACT, WITH IOL INSERTION
Anesthesia: Monitor Anesthesia Care | Site: Eye | Laterality: Right

## 2019-12-13 MED ORDER — TETRACAINE HCL 0.5 % OP SOLN
1.0000 [drp] | OPHTHALMIC | Status: DC | PRN
  Administered 2019-12-13 (×3): 1 [drp] via OPHTHALMIC

## 2019-12-13 MED ORDER — BRIMONIDINE TARTRATE-TIMOLOL 0.2-0.5 % OP SOLN
OPHTHALMIC | Status: DC | PRN
  Administered 2019-12-13: 1 [drp] via OPHTHALMIC

## 2019-12-13 MED ORDER — MIDAZOLAM HCL 2 MG/2ML IJ SOLN
INTRAMUSCULAR | Status: DC | PRN
  Administered 2019-12-13: 1.5 mg via INTRAVENOUS

## 2019-12-13 MED ORDER — EPINEPHRINE PF 1 MG/ML IJ SOLN
INTRAOCULAR | Status: DC | PRN
  Administered 2019-12-13: 09:00:00 44 mL via OPHTHALMIC

## 2019-12-13 MED ORDER — FENTANYL CITRATE (PF) 100 MCG/2ML IJ SOLN
INTRAMUSCULAR | Status: DC | PRN
  Administered 2019-12-13: 50 ug via INTRAVENOUS

## 2019-12-13 MED ORDER — LIDOCAINE HCL (PF) 2 % IJ SOLN
INTRAOCULAR | Status: DC | PRN
  Administered 2019-12-13: 1 mL

## 2019-12-13 MED ORDER — LACTATED RINGERS IV SOLN: 100.0000 mL/h | INTRAVENOUS | Status: DC

## 2019-12-13 MED ORDER — NA CHONDROIT SULF-NA HYALURON 40-17 MG/ML IO SOLN
INTRAOCULAR | Status: DC | PRN
  Administered 2019-12-13: 1 mL via INTRAOCULAR

## 2019-12-13 MED ORDER — ARMC OPHTHALMIC DILATING DROPS
1.0000 "application " | OPHTHALMIC | Status: DC | PRN
  Administered 2019-12-13 (×3): 1 via OPHTHALMIC

## 2019-12-13 MED ORDER — MOXIFLOXACIN HCL 0.5 % OP SOLN
OPHTHALMIC | Status: DC | PRN
  Administered 2019-12-13: 0.2 mL via OPHTHALMIC

## 2019-12-13 SURGICAL SUPPLY — 21 items
CANNULA ANT/CHMB 27G (MISCELLANEOUS) ×2 IMPLANT
CANNULA ANT/CHMB 27GA (MISCELLANEOUS) ×6 IMPLANT
DISSECTOR HYDRO NUCLEUS 50X22 (MISCELLANEOUS) ×3 IMPLANT
GLOVE SURG LX 8.0 MICRO (GLOVE) ×2
GLOVE SURG LX STRL 8.0 MICRO (GLOVE) ×1 IMPLANT
GLOVE SURG TRIUMPH 8.0 PF LTX (GLOVE) ×3 IMPLANT
GOWN STRL REUS W/ TWL LRG LVL3 (GOWN DISPOSABLE) ×2 IMPLANT
GOWN STRL REUS W/TWL LRG LVL3 (GOWN DISPOSABLE) ×4
LENS IOL DIOP 20.5 (Intraocular Lens) ×3 IMPLANT
LENS IOL TECNIS MONO 20.5 (Intraocular Lens) IMPLANT
MARKER SKIN DUAL TIP RULER LAB (MISCELLANEOUS) ×3 IMPLANT
NDL FILTER BLUNT 18X1 1/2 (NEEDLE) ×1 IMPLANT
NEEDLE FILTER BLUNT 18X 1/2SAF (NEEDLE) ×2
NEEDLE FILTER BLUNT 18X1 1/2 (NEEDLE) ×1 IMPLANT
PACK EYE AFTER SURG (MISCELLANEOUS) ×3 IMPLANT
PACK OPTHALMIC (MISCELLANEOUS) ×3 IMPLANT
PACK PORFILIO (MISCELLANEOUS) ×3 IMPLANT
SYR 3ML LL SCALE MARK (SYRINGE) ×3 IMPLANT
SYR TB 1ML LUER SLIP (SYRINGE) ×3 IMPLANT
WATER STERILE IRR 250ML POUR (IV SOLUTION) ×3 IMPLANT
WIPE NON LINTING 3.25X3.25 (MISCELLANEOUS) ×3 IMPLANT

## 2019-12-13 NOTE — Op Note (Signed)
PREOPERATIVE DIAGNOSIS:  Nuclear sclerotic cataract of the right eye.   POSTOPERATIVE DIAGNOSIS:  H25.11 Cataract   OPERATIVE PROCEDURE:@   SURGEON:  Birder Robson, MD.   ANESTHESIA:  Anesthesiologist: Heniser, Fredric Dine, MD CRNA: Mayme Genta, CRNA; Vanetta Shawl, CRNA  1.      Managed anesthesia care. 2.      0.81ml of Shugarcaine was instilled in the eye following the paracentesis.   COMPLICATIONS:  None.   TECHNIQUE:   Stop and chop   DESCRIPTION OF PROCEDURE:  The patient was examined and consented in the preoperative holding area where the aforementioned topical anesthesia was applied to the right eye and then brought back to the Operating Room where the right eye was prepped and draped in the usual sterile ophthalmic fashion and a lid speculum was placed. A paracentesis was created with the side port blade and the anterior chamber was filled with viscoelastic. A near clear corneal incision was performed with the steel keratome. A continuous curvilinear capsulorrhexis was performed with a cystotome followed by the capsulorrhexis forceps. Hydrodissection and hydrodelineation were carried out with BSS on a blunt cannula. The lens was removed in a stop and chop  technique and the remaining cortical material was removed with the irrigation-aspiration handpiece. The capsular bag was inflated with viscoelastic and the Technis [id number]  lens was placed in the capsular bag without complication. The remaining viscoelastic was removed from the eye with the irrigation-aspiration handpiece. The wounds were hydrated. The anterior chamber was flushed with BSS and the eye was inflated to physiologic pressure. 0.38ml of Vigamox was placed in the anterior chamber. The wounds were found to be water tight. The eye was dressed with Combigan. The patient was given protective glasses to wear throughout the day and a shield with which to sleep tonight. The patient was also given drops with which to begin a drop  regimen today and will follow-up with me in one day. Implant Name Type Inv. Item Serial No. Manufacturer Lot No. LRB No. Used Action  LENS IOL DIOP 20.5 - [id number] Intraocular Lens LENS IOL DIOP 20.5 [id number]  Right 1 Implanted   Procedure(s): CATARACT EXTRACTION PHACO AND INTRAOCULAR LENS PLACEMENT (IOC) RIGHT 5.89  00:40.8 (Right)  Electronically signed: Birder Robson 12/13/2019 9:06 AM

## 2019-12-13 NOTE — H&P (Signed)
All labs reviewed. Abnormal studies sent to patients PCP when indicated.  Previous H&P reviewed, patient examined, there are NO CHANGES.  Gwyndolyn Saxon Porfilio4/13/20218:42 AM

## 2019-12-13 NOTE — Anesthesia Procedure Notes (Signed)
Procedure Name: MAC Performed by: Griselda Tosh, CRNA Pre-anesthesia Checklist: Patient identified, Emergency Drugs available, Suction available, Timeout performed and Patient being monitored Patient Re-evaluated:Patient Re-evaluated prior to induction Oxygen Delivery Method: Nasal cannula Placement Confirmation: positive ETCO2       

## 2019-12-13 NOTE — Transfer of Care (Signed)
Immediate Anesthesia Transfer of Care Note  Patient: Maria Garcia  Procedure(s) Performed: CATARACT EXTRACTION PHACO AND INTRAOCULAR LENS PLACEMENT (IOC) RIGHT 5.89  00:40.8 (Right Eye)  Patient Location: PACU  Anesthesia Type: MAC  Level of Consciousness: awake, alert  and patient cooperative  Airway and Oxygen Therapy: Patient Spontanous Breathing and Patient connected to supplemental oxygen  Post-op Assessment: Post-op Vital signs reviewed, Patient's Cardiovascular Status Stable, Respiratory Function Stable, Patent Airway and No signs of Nausea or vomiting  Post-op Vital Signs: Reviewed and stable  Complications: No apparent anesthesia complications

## 2019-12-13 NOTE — Anesthesia Postprocedure Evaluation (Signed)
Anesthesia Post Note  Patient: Maria Garcia  Procedure(s) Performed: CATARACT EXTRACTION PHACO AND INTRAOCULAR LENS PLACEMENT (IOC) RIGHT 5.89  00:40.8 (Right Eye)     Patient location during evaluation: PACU Anesthesia Type: MAC Level of consciousness: awake and alert Pain management: pain level controlled Vital Signs Assessment: post-procedure vital signs reviewed and stable Respiratory status: spontaneous breathing, nonlabored ventilation, respiratory function stable and patient connected to nasal cannula oxygen Cardiovascular status: stable and blood pressure returned to baseline Postop Assessment: no apparent nausea or vomiting Anesthetic complications: no    Christean Silvestri A  Haile Bosler

## 2019-12-14 ENCOUNTER — Encounter: Payer: Self-pay | Admitting: *Deleted

## 2020-03-09 DIAGNOSIS — Z131 Encounter for screening for diabetes mellitus: Secondary | ICD-10-CM | POA: Diagnosis not present

## 2020-03-09 DIAGNOSIS — Z79899 Other long term (current) drug therapy: Secondary | ICD-10-CM | POA: Diagnosis not present

## 2020-03-09 DIAGNOSIS — N183 Chronic kidney disease, stage 3 unspecified: Secondary | ICD-10-CM | POA: Diagnosis not present

## 2020-03-09 DIAGNOSIS — E782 Mixed hyperlipidemia: Secondary | ICD-10-CM | POA: Diagnosis not present

## 2020-03-09 DIAGNOSIS — M546 Pain in thoracic spine: Secondary | ICD-10-CM | POA: Diagnosis not present

## 2020-03-09 DIAGNOSIS — M545 Low back pain: Secondary | ICD-10-CM | POA: Diagnosis not present

## 2020-03-09 DIAGNOSIS — G8929 Other chronic pain: Secondary | ICD-10-CM | POA: Diagnosis not present

## 2020-03-09 DIAGNOSIS — Z0000 Encounter for general adult medical examination without abnormal findings: Secondary | ICD-10-CM | POA: Diagnosis not present

## 2020-03-09 DIAGNOSIS — K219 Gastro-esophageal reflux disease without esophagitis: Secondary | ICD-10-CM | POA: Diagnosis not present

## 2020-03-09 DIAGNOSIS — Z1231 Encounter for screening mammogram for malignant neoplasm of breast: Secondary | ICD-10-CM | POA: Diagnosis not present

## 2020-03-12 ENCOUNTER — Other Ambulatory Visit: Payer: Self-pay | Admitting: Nurse Practitioner

## 2020-03-12 DIAGNOSIS — Z1231 Encounter for screening mammogram for malignant neoplasm of breast: Secondary | ICD-10-CM

## 2020-03-19 ENCOUNTER — Other Ambulatory Visit: Payer: Self-pay

## 2020-03-19 ENCOUNTER — Ambulatory Visit
Admission: RE | Admit: 2020-03-19 | Discharge: 2020-03-19 | Disposition: A | Discharge status: Home / Self Care | Payer: PPO | Source: Ambulatory Visit | Attending: Nurse Practitioner | Admitting: Nurse Practitioner

## 2020-03-19 DIAGNOSIS — Z1231 Encounter for screening mammogram for malignant neoplasm of breast: Secondary | ICD-10-CM | POA: Insufficient documentation

## 2020-07-30 DIAGNOSIS — Z961 Presence of intraocular lens: Secondary | ICD-10-CM | POA: Diagnosis not present

## 2020-09-10 DIAGNOSIS — M546 Pain in thoracic spine: Secondary | ICD-10-CM | POA: Diagnosis not present

## 2020-09-10 DIAGNOSIS — E782 Mixed hyperlipidemia: Secondary | ICD-10-CM | POA: Diagnosis not present

## 2020-09-10 DIAGNOSIS — G8929 Other chronic pain: Secondary | ICD-10-CM | POA: Diagnosis not present

## 2020-09-10 DIAGNOSIS — Z79899 Other long term (current) drug therapy: Secondary | ICD-10-CM | POA: Diagnosis not present

## 2020-09-10 DIAGNOSIS — M545 Low back pain, unspecified: Secondary | ICD-10-CM | POA: Diagnosis not present

## 2020-09-10 DIAGNOSIS — K219 Gastro-esophageal reflux disease without esophagitis: Secondary | ICD-10-CM | POA: Diagnosis not present

## 2020-11-28 DIAGNOSIS — M79671 Pain in right foot: Secondary | ICD-10-CM | POA: Diagnosis not present

## 2020-11-28 DIAGNOSIS — L84 Corns and callosities: Secondary | ICD-10-CM | POA: Diagnosis not present

## 2020-12-17 DIAGNOSIS — D2371 Other benign neoplasm of skin of right lower limb, including hip: Secondary | ICD-10-CM | POA: Diagnosis not present

## 2020-12-17 DIAGNOSIS — G5761 Lesion of plantar nerve, right lower limb: Secondary | ICD-10-CM | POA: Diagnosis not present

## 2020-12-17 DIAGNOSIS — L909 Atrophic disorder of skin, unspecified: Secondary | ICD-10-CM | POA: Diagnosis not present

## 2021-01-29 DIAGNOSIS — K120 Recurrent oral aphthae: Secondary | ICD-10-CM | POA: Diagnosis not present

## 2021-03-17 IMAGING — MG DIGITAL SCREENING BILATERAL MAMMOGRAM WITH TOMO AND CAD
6 of 10 series · 6 of 30 positions shown · non-contrast
Comparison: Previous exam(s).

CLINICAL DATA: Screening.

EXAM:
DIGITAL SCREENING BILATERAL MAMMOGRAM WITH TOMO AND CAD

[R CC synth-2D]
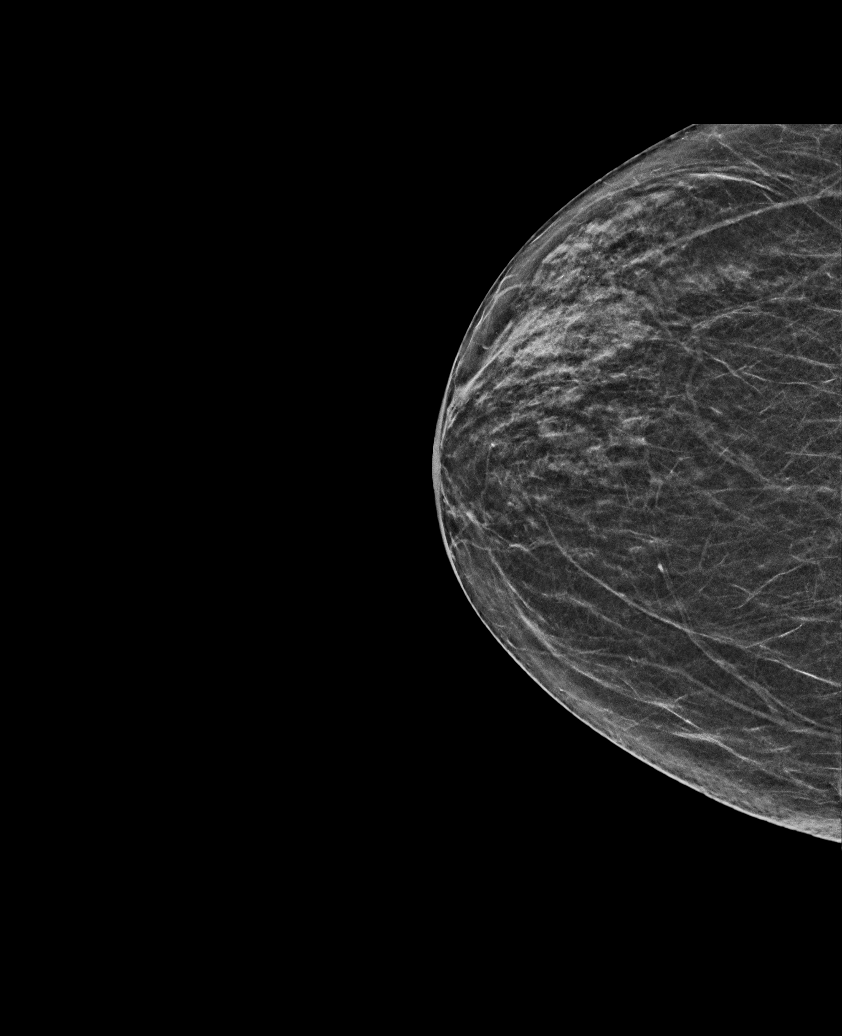

[L CC synth-2D]
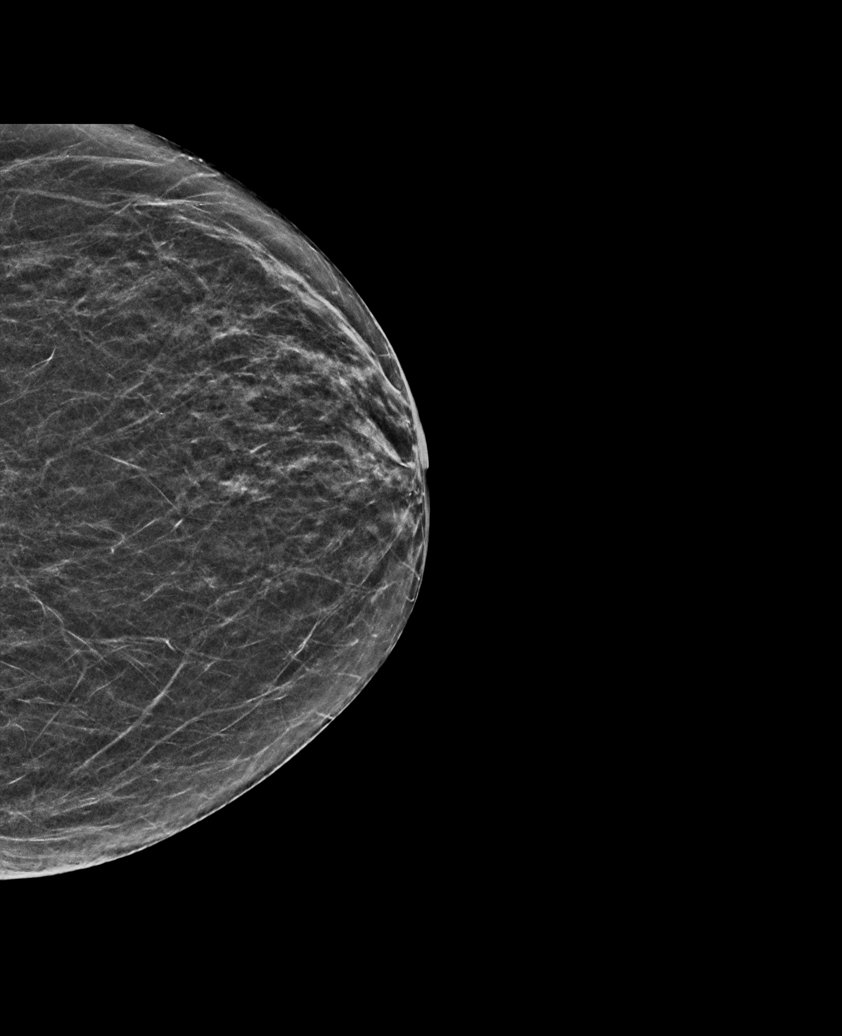

[R MLO synth-2D]
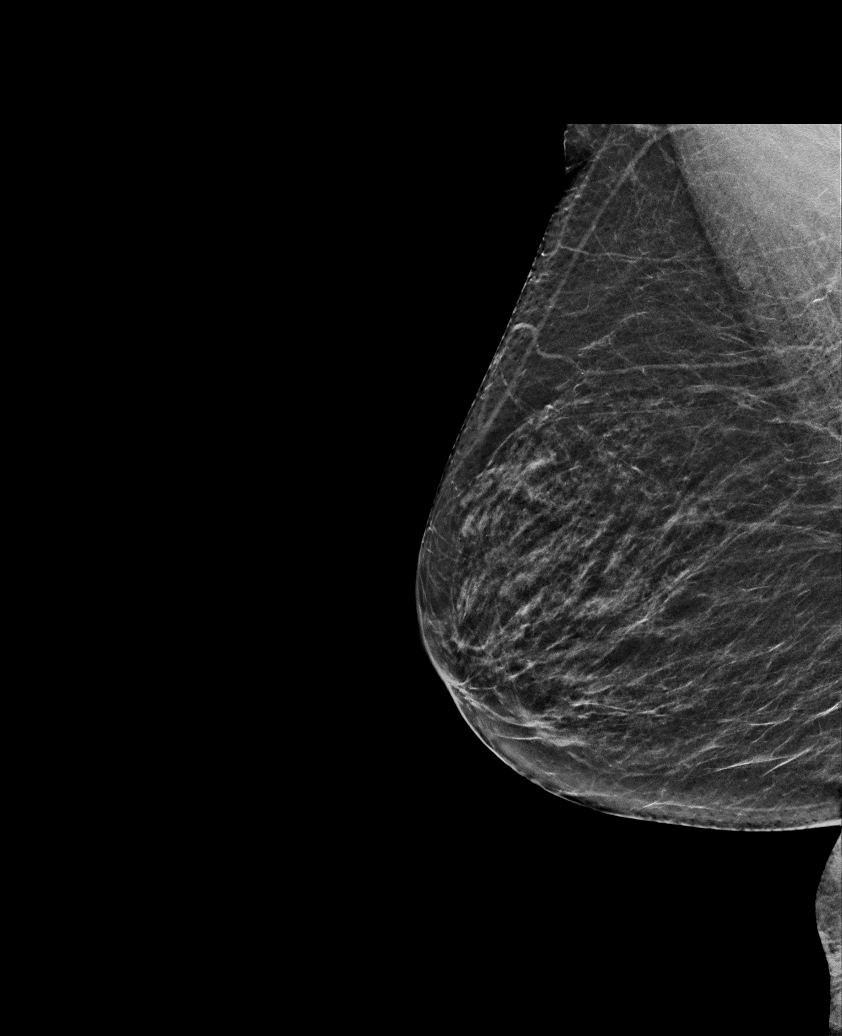

[L MLO synth-2D (1 of 2)]
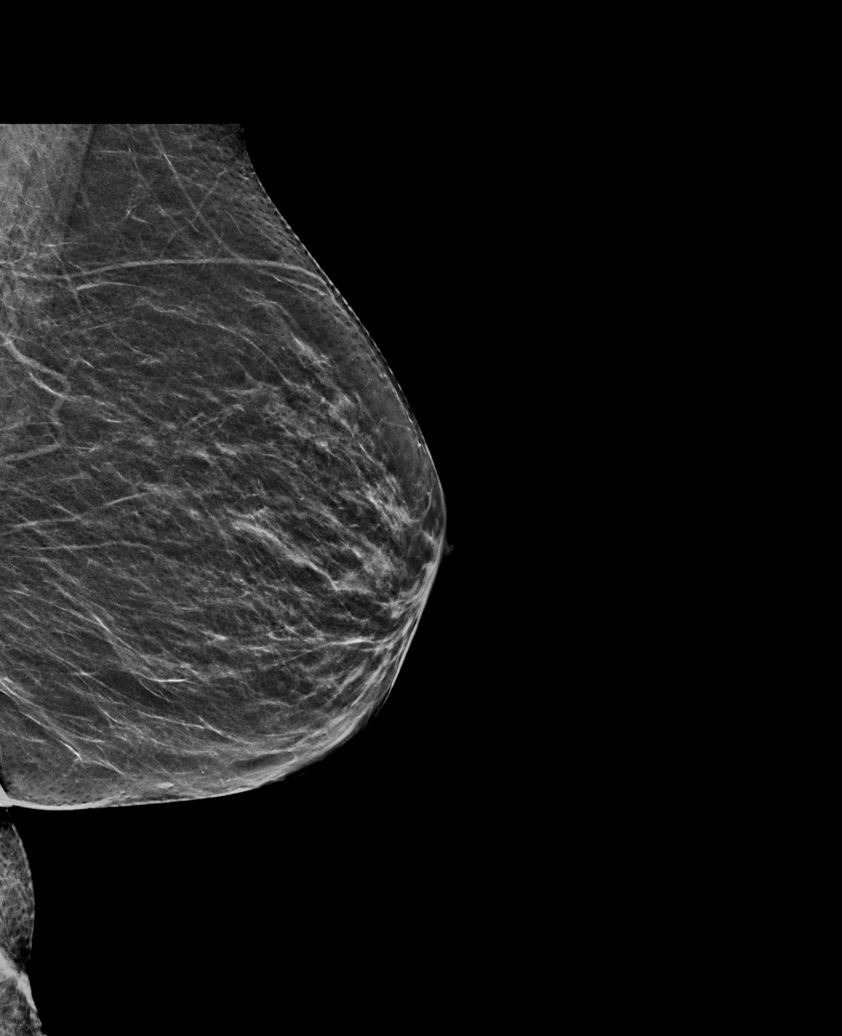

[L MLO synth-2D (2 of 2)]
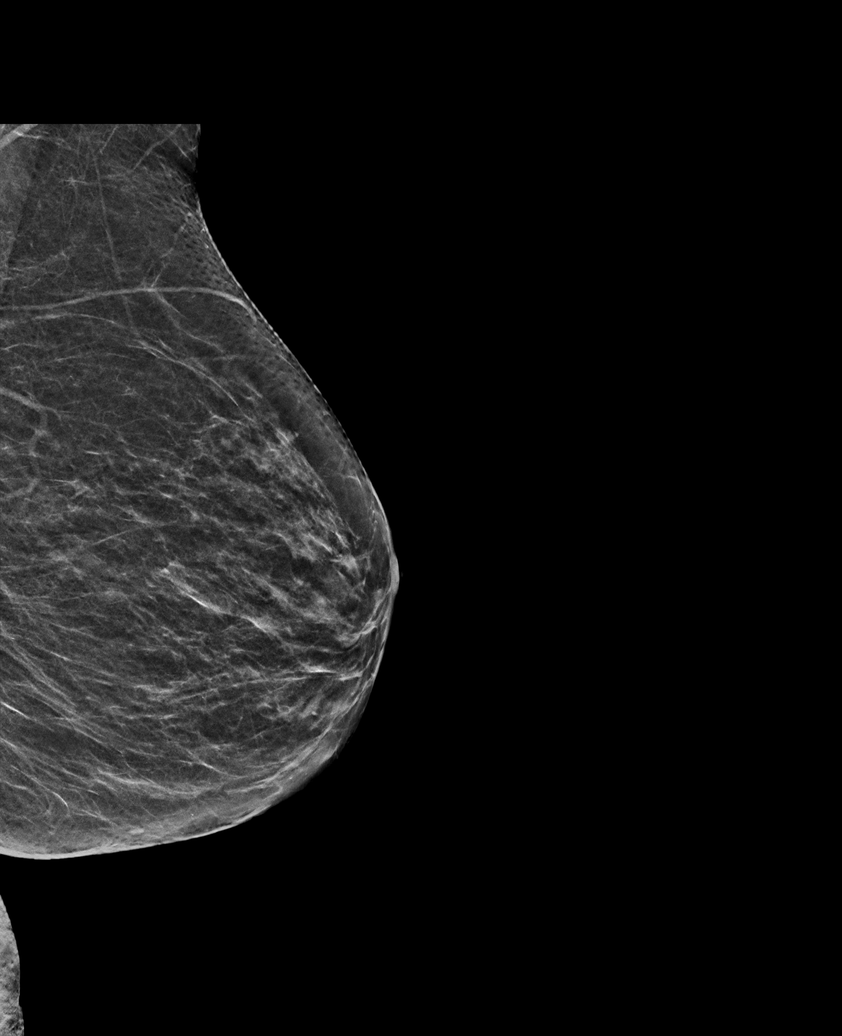

[L CC tomo · tomo slice 24/47.0]
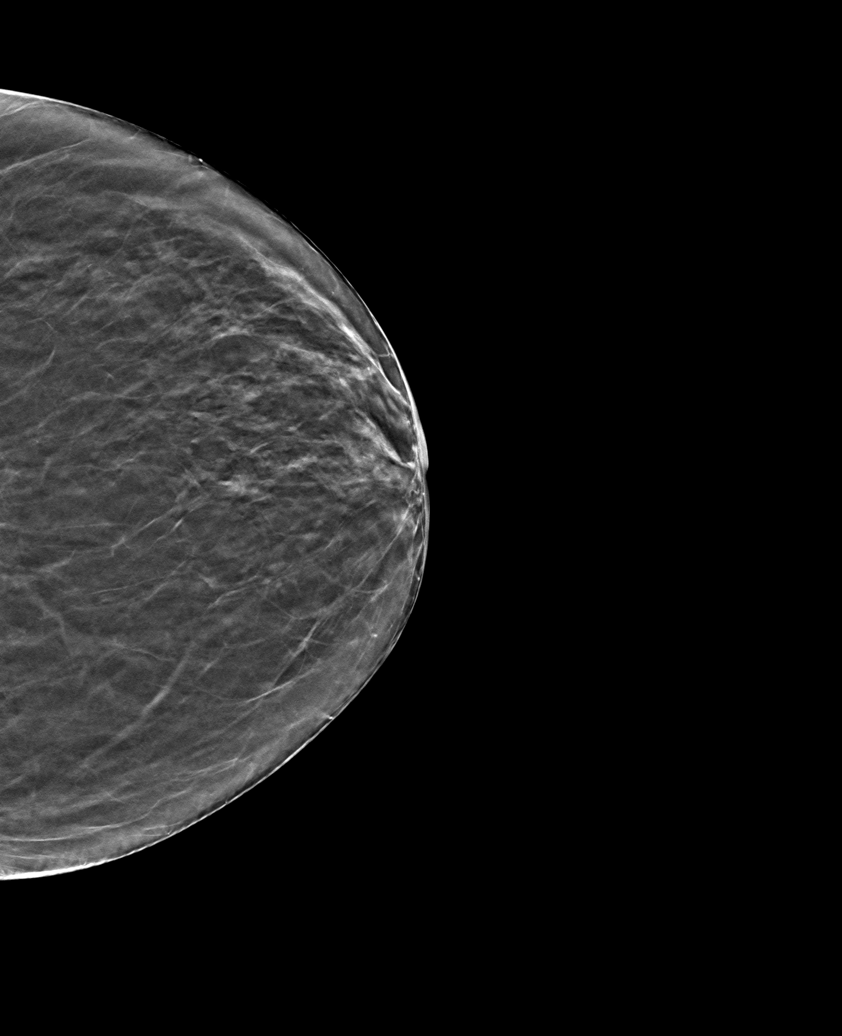

[6 of 30 positions shown; findings below may reference images not displayed]

ACR Breast Density Category b: There are scattered areas of
fibroglandular density.
FINDINGS: There are no findings suspicious for malignancy. Images were
processed with CAD.
IMPRESSION: No mammographic evidence of malignancy. A result letter of this
screening mammogram will be mailed directly to the patient.

RECOMMENDATION:
Screening mammogram in one year. (Code:[id number])

BI-RADS CATEGORY  1: Negative.

## 2021-04-01 DIAGNOSIS — Z79899 Other long term (current) drug therapy: Secondary | ICD-10-CM | POA: Diagnosis not present

## 2021-04-01 DIAGNOSIS — Z0000 Encounter for general adult medical examination without abnormal findings: Secondary | ICD-10-CM | POA: Diagnosis not present

## 2021-04-01 DIAGNOSIS — E782 Mixed hyperlipidemia: Secondary | ICD-10-CM | POA: Diagnosis not present

## 2021-04-01 DIAGNOSIS — M545 Low back pain, unspecified: Secondary | ICD-10-CM | POA: Diagnosis not present

## 2021-04-01 DIAGNOSIS — Z1231 Encounter for screening mammogram for malignant neoplasm of breast: Secondary | ICD-10-CM | POA: Diagnosis not present

## 2021-04-01 DIAGNOSIS — R7302 Impaired glucose tolerance (oral): Secondary | ICD-10-CM | POA: Diagnosis not present

## 2021-04-01 DIAGNOSIS — Z1211 Encounter for screening for malignant neoplasm of colon: Secondary | ICD-10-CM | POA: Diagnosis not present

## 2021-04-01 DIAGNOSIS — N183 Chronic kidney disease, stage 3 unspecified: Secondary | ICD-10-CM | POA: Diagnosis not present

## 2021-04-01 DIAGNOSIS — M546 Pain in thoracic spine: Secondary | ICD-10-CM | POA: Diagnosis not present

## 2021-04-01 DIAGNOSIS — K219 Gastro-esophageal reflux disease without esophagitis: Secondary | ICD-10-CM | POA: Diagnosis not present

## 2021-04-01 DIAGNOSIS — G8929 Other chronic pain: Secondary | ICD-10-CM | POA: Diagnosis not present

## 2021-04-02 ENCOUNTER — Other Ambulatory Visit: Payer: Self-pay | Admitting: Nurse Practitioner

## 2021-04-02 DIAGNOSIS — Z1231 Encounter for screening mammogram for malignant neoplasm of breast: Secondary | ICD-10-CM

## 2021-04-16 ENCOUNTER — Ambulatory Visit
Admission: RE | Admit: 2021-04-16 | Discharge: 2021-04-16 | Disposition: A | Discharge status: Home / Self Care | Payer: PPO | Source: Ambulatory Visit | Attending: Nurse Practitioner | Admitting: Nurse Practitioner

## 2021-04-16 ENCOUNTER — Other Ambulatory Visit: Payer: Self-pay

## 2021-04-16 DIAGNOSIS — Z1231 Encounter for screening mammogram for malignant neoplasm of breast: Secondary | ICD-10-CM | POA: Insufficient documentation

## 2021-07-31 DIAGNOSIS — H43813 Vitreous degeneration, bilateral: Secondary | ICD-10-CM | POA: Diagnosis not present

## 2021-08-27 DIAGNOSIS — K219 Gastro-esophageal reflux disease without esophagitis: Secondary | ICD-10-CM | POA: Diagnosis not present

## 2021-08-27 DIAGNOSIS — Z800 Family history of malignant neoplasm of digestive organs: Secondary | ICD-10-CM | POA: Diagnosis not present

## 2021-08-27 DIAGNOSIS — Z1211 Encounter for screening for malignant neoplasm of colon: Secondary | ICD-10-CM | POA: Diagnosis not present

## 2021-10-04 DIAGNOSIS — Z03818 Encounter for observation for suspected exposure to other biological agents ruled out: Secondary | ICD-10-CM | POA: Diagnosis not present

## 2021-10-04 DIAGNOSIS — U071 COVID-19: Secondary | ICD-10-CM | POA: Diagnosis not present

## 2021-10-09 ENCOUNTER — Ambulatory Visit: Admit: 2021-10-09 | Payer: PPO | Admitting: Internal Medicine

## 2021-10-09 SURGERY — COLONOSCOPY WITH PROPOFOL
Anesthesia: General

## 2021-12-02 DIAGNOSIS — G8929 Other chronic pain: Secondary | ICD-10-CM | POA: Diagnosis not present

## 2021-12-02 DIAGNOSIS — K219 Gastro-esophageal reflux disease without esophagitis: Secondary | ICD-10-CM | POA: Diagnosis not present

## 2021-12-02 DIAGNOSIS — R7302 Impaired glucose tolerance (oral): Secondary | ICD-10-CM | POA: Diagnosis not present

## 2021-12-02 DIAGNOSIS — N183 Chronic kidney disease, stage 3 unspecified: Secondary | ICD-10-CM | POA: Diagnosis not present

## 2021-12-02 DIAGNOSIS — M545 Low back pain, unspecified: Secondary | ICD-10-CM | POA: Diagnosis not present

## 2021-12-02 DIAGNOSIS — E782 Mixed hyperlipidemia: Secondary | ICD-10-CM | POA: Diagnosis not present

## 2021-12-02 DIAGNOSIS — M546 Pain in thoracic spine: Secondary | ICD-10-CM | POA: Diagnosis not present

## 2021-12-02 DIAGNOSIS — Z79899 Other long term (current) drug therapy: Secondary | ICD-10-CM | POA: Diagnosis not present

## 2022-01-29 DIAGNOSIS — R600 Localized edema: Secondary | ICD-10-CM | POA: Diagnosis not present

## 2022-01-29 DIAGNOSIS — N183 Chronic kidney disease, stage 3 unspecified: Secondary | ICD-10-CM | POA: Diagnosis not present

## 2022-02-18 ENCOUNTER — Encounter: Payer: Self-pay | Admitting: Internal Medicine

## 2022-02-19 ENCOUNTER — Encounter: Payer: Self-pay | Admitting: Internal Medicine

## 2022-02-19 ENCOUNTER — Ambulatory Visit: Payer: PPO | Admitting: Anesthesiology

## 2022-02-19 ENCOUNTER — Encounter
Admission: RE | Disposition: A | Discharge status: Home / Self Care | Payer: Self-pay | Source: Home / Self Care | Attending: Internal Medicine

## 2022-02-19 ENCOUNTER — Other Ambulatory Visit: Payer: Self-pay

## 2022-02-19 ENCOUNTER — Ambulatory Visit
Admission: RE | Admit: 2022-02-19 | Discharge: 2022-02-19 | Disposition: A | Discharge status: Home / Self Care | Payer: PPO | Attending: Internal Medicine | Admitting: Internal Medicine

## 2022-02-19 DIAGNOSIS — Z1211 Encounter for screening for malignant neoplasm of colon: Secondary | ICD-10-CM | POA: Diagnosis not present

## 2022-02-19 DIAGNOSIS — K640 First degree hemorrhoids: Secondary | ICD-10-CM | POA: Diagnosis not present

## 2022-02-19 DIAGNOSIS — K5730 Diverticulosis of large intestine without perforation or abscess without bleeding: Secondary | ICD-10-CM | POA: Insufficient documentation

## 2022-02-19 DIAGNOSIS — Z800 Family history of malignant neoplasm of digestive organs: Secondary | ICD-10-CM | POA: Insufficient documentation

## 2022-02-19 DIAGNOSIS — K648 Other hemorrhoids: Secondary | ICD-10-CM | POA: Diagnosis not present

## 2022-02-19 DIAGNOSIS — E7800 Pure hypercholesterolemia, unspecified: Secondary | ICD-10-CM | POA: Diagnosis not present

## 2022-02-19 HISTORY — DX: Chronic kidney disease, unspecified: N18.9

## 2022-02-19 HISTORY — DX: Gastro-esophageal reflux disease without esophagitis: K21.9

## 2022-02-19 HISTORY — DX: Impaired glucose tolerance (oral): R73.02

## 2022-02-19 HISTORY — DX: Other specified bacterial intestinal infections: A04.8

## 2022-02-19 HISTORY — PX: COLONOSCOPY: SHX5424

## 2022-02-19 HISTORY — DX: Cardiac arrhythmia, unspecified: I49.9

## 2022-02-19 SURGERY — COLONOSCOPY
Anesthesia: General

## 2022-02-19 MED ORDER — PROPOFOL 10 MG/ML IV BOLUS
INTRAVENOUS | Status: DC | PRN
  Administered 2022-02-19: 40 mg via INTRAVENOUS

## 2022-02-19 MED ORDER — SODIUM CHLORIDE 0.9 % IV SOLN: INTRAVENOUS | Status: DC | PRN

## 2022-02-19 MED ORDER — PROPOFOL 500 MG/50ML IV EMUL
INTRAVENOUS | Status: DC | PRN
  Administered 2022-02-19: 200 ug/kg/min via INTRAVENOUS

## 2022-02-19 MED ORDER — SODIUM CHLORIDE 0.9 % IV SOLN: INTRAVENOUS | Status: DC

## 2022-02-19 NOTE — H&P (Signed)
Outpatient short stay form Pre-procedure 02/19/2022 12:44 PM Kimberl Vig K. Alice Reichert, M.D.  Primary Physician: Gaetano Net, NP  Reason for visit:  Family history of colon cancer - Mother  History of present illness:   76 year old patient presenting for family history of colon cancer. Patient denies any change in bowel habits, rectal bleeding or involuntary weight loss.    No current facility-administered medications for this encounter.  Current Outpatient Medications:    traMADol (ULTRAM) 50 MG tablet, Take 50 mg by mouth every 6 (six) hours as needed., Disp: , Rfl:    acetaminophen (TYLENOL) 650 MG CR tablet, Take 650 mg by mouth every 8 (eight) hours as needed for pain., Disp: , Rfl:    CALCIUM PO, Take by mouth., Disp: , Rfl:    Glucosamine-Chondroitin (OSTEO BI-FLEX REGULAR STRENGTH PO), Take by mouth., Disp: , Rfl:    Magnesium 200 MG TABS, Take by mouth daily., Disp: , Rfl:    Multiple Vitamin (MULTIVITAMIN) tablet, Take 1 tablet by mouth daily., Disp: , Rfl:    pantoprazole (PROTONIX) 40 MG tablet, Take 40 mg by mouth daily., Disp: , Rfl:    raloxifene (EVISTA) 60 MG tablet, Take 60 mg by mouth daily., Disp: , Rfl:    simvastatin (ZOCOR) 20 MG tablet, Take 20 mg by mouth daily., Disp: , Rfl:   No medications prior to admission.     No Known Allergies   Past Medical History:  Diagnosis Date   Arthritis    Bacterial infection due to H. pylori    Chronic kidney disease    DJD (degenerative joint disease)    Dysrhythmia    GERD (gastroesophageal reflux disease)    Hypercholesteremia    IGT (impaired glucose tolerance)    Motion sickness    boats   Osteoporosis    Rosacea    Wears dentures    partial upper    Review of systems:  Otherwise negative.    Physical Exam  Gen: Alert, oriented. Appears stated age.  HEENT: [place]/AT. PERRLA. Lungs: CTA, no wheezes. CV: RR nl S1, S2. Abd: soft, benign, no masses. BS+ Ext: No edema. Pulses 2+    Planned procedures:  Proceed with colonoscopy. The patient understands the nature of the planned procedure, indications, risks, alternatives and potential complications including but not limited to bleeding, infection, perforation, damage to internal organs and possible oversedation/side effects from anesthesia. The patient agrees and gives consent to proceed.  Please refer to procedure notes for findings, recommendations and patient disposition/instructions.     Celestial Barnfield K. Alice Reichert, M.D. Gastroenterology 02/19/2022  12:44 PM

## 2022-02-19 NOTE — Interval H&P Note (Signed)
History and Physical Interval Note:  02/19/2022 1:33 PM  Maria Garcia  has presented today for surgery, with the diagnosis of Family history of colon cancer in father (Z80.0).  The various methods of treatment have been discussed with the patient and family. After consideration of risks, benefits and other options for treatment, the patient has consented to  Procedure(s): COLONOSCOPY (N/A) as a surgical intervention.  The patient's history has been reviewed, patient examined, no change in status, stable for surgery.  I have reviewed the patient's chart and labs.  Questions were answered to the patient's satisfaction.     [place], [place]

## 2022-02-19 NOTE — Anesthesia Preprocedure Evaluation (Signed)
Anesthesia Evaluation  Patient identified by MRN, date of birth, ID band Patient awake    Reviewed: Allergy & Precautions, H&P , NPO status , Patient's Chart, lab work & pertinent test results, reviewed documented beta blocker date and time   Airway Mallampati: II   Neck ROM: full    Dental  (+) Poor Dentition   Pulmonary neg pulmonary ROS, former smoker,    Pulmonary exam normal        Cardiovascular Exercise Tolerance: Good Normal cardiovascular exam+ dysrhythmias  Rhythm:regular Rate:Normal     Neuro/Psych negative neurological ROS  negative psych ROS   GI/Hepatic Neg liver ROS, GERD  Medicated,  Endo/Other  negative endocrine ROS  Renal/GU Renal disease  negative genitourinary   Musculoskeletal   Abdominal   Peds  Hematology negative hematology ROS (+)   Anesthesia Other Findings Past Medical History: No date: Arthritis No date: Bacterial infection due to H. pylori No date: Chronic kidney disease No date: DJD (degenerative joint disease) No date: Dysrhythmia No date: GERD (gastroesophageal reflux disease) No date: Hypercholesteremia No date: IGT (impaired glucose tolerance) No date: Motion sickness     Comment:  boats No date: Osteoporosis No date: Rosacea No date: Wears dentures     Comment:  partial upper Past Surgical History: No date: ABDOMINAL HYSTERECTOMY No date: BILATERAL CARPAL TUNNEL RELEASE 11/15/2019: CATARACT EXTRACTION W/PHACO; Left     Comment:  Procedure: CATARACT EXTRACTION PHACO AND INTRAOCULAR               LENS PLACEMENT (IOC) LEFT 7.20 00:40.0;  Surgeon:               Birder Robson, MD;  Location: [facility];                Service: Ophthalmology;  Laterality: Left;  prefers early              or last 12/13/2019: CATARACT EXTRACTION W/PHACO; Right     Comment:  Procedure: CATARACT EXTRACTION PHACO AND INTRAOCULAR               LENS PLACEMENT (IOC) RIGHT 5.89   00:40.8;  Surgeon:               Birder Robson, MD;  Location: [facility];                Service: Ophthalmology;  Laterality: Right; No date: CHOLECYSTECTOMY 09/25/2015: COLONOSCOPY; N/A     Comment:  Procedure: COLONOSCOPY;  Surgeon: Hulen Luster, MD;                Location: [facility];  Service:               Gastroenterology;  Laterality: N/A; 08/18/2017: ESOPHAGOGASTRODUODENOSCOPY (EGD) WITH PROPOFOL; N/A     Comment:  Procedure: ESOPHAGOGASTRODUODENOSCOPY (EGD) WITH               PROPOFOL;  Surgeon: Toledo, Benay Pike, MD;  Location:               [facility];  Service: Gastroenterology;  Laterality:               N/A; No date: EYE SURGERY BMI    Body Mass Index: 20.80 kg/m     Reproductive/Obstetrics negative OB ROS                             Anesthesia Physical Anesthesia Plan  ASA: 3  Anesthesia Plan: General   Post-op Pain Management:    Induction:   PONV Risk Score and Plan:   Airway Management Planned:   Additional Equipment:   Intra-op Plan:   Post-operative Plan:   Informed Consent: I have reviewed the patients History and Physical, chart, labs and discussed the procedure including the risks, benefits and alternatives for the proposed anesthesia with the patient or authorized representative who has indicated his/her understanding and acceptance.     Dental Advisory Given  Plan Discussed with: CRNA  Anesthesia Plan Comments:         Anesthesia Quick Evaluation

## 2022-02-19 NOTE — Transfer of Care (Signed)
Immediate Anesthesia Transfer of Care Note  Patient: Maria Garcia  Procedure(s) Performed: COLONOSCOPY  Patient Location: PACU  Anesthesia Type:General  Level of Consciousness: sedated  Airway & Oxygen Therapy: Patient Spontanous Breathing and Patient connected to nasal cannula oxygen  Post-op Assessment: Report given to RN and Post -op Vital signs reviewed and stable  Post vital signs: Reviewed and stable  Last Vitals:  Vitals Value Taken Time  BP 106/69 02/19/22 1358  Temp 36.4 C 02/19/22 1358  Pulse 81 02/19/22 1359  Resp 14 02/19/22 1359  SpO2 98 % 02/19/22 1359  Vitals shown include unvalidated device data.  Last Pain:  Vitals:   02/19/22 1358  TempSrc: Temporal  PainSc: Asleep         Complications: No notable events documented.

## 2022-02-19 NOTE — Op Note (Signed)
[facility] Gastroenterology Patient Name: Maria Garcia Procedure Date: 02/19/2022 1:33 PM MRN: [id number] Account #: [id number] Date of Birth: [birth year] Admit Type: Outpatient Age: 76 Room: [facility] ENDO ROOM 2 Gender: Female Note Status: Finalized Instrument Name: Park Meo [id number] Procedure:             Colonoscopy Indications:           Screening patient at increased risk: Family history of                         1st-degree relative with colorectal cancer at age 21                         years (or older) Providers:             Sharmarke Cicio K. Kyira Volkert MD, MD Medicines:             Propofol per Anesthesia Complications:         No immediate complications. Procedure:             Pre-Anesthesia Assessment:                        - The risks and benefits of the procedure and the                         sedation options and risks were discussed with the                         patient. All questions were answered and informed                         consent was obtained.                        - Patient identification and proposed procedure were                         verified prior to the procedure by the nurse. The                         procedure was verified in the procedure room.                        - ASA Grade Assessment: III - A patient with severe                         systemic disease.                        - After reviewing the risks and benefits, the patient                         was deemed in satisfactory condition to undergo the                         procedure.                        After obtaining informed consent, the colonoscope was  passed under direct vision. Throughout the procedure,                         the patient's blood pressure, pulse, and oxygen                         saturations were monitored continuously. The                         Colonoscope was introduced through the anus and                          advanced to the the cecum, identified by appendiceal                         orifice and ileocecal valve. The colonoscopy was                         performed without difficulty. The patient tolerated                         the procedure well. The quality of the bowel                         preparation was good. The ileocecal valve, appendiceal                         orifice, and rectum were photographed. Findings:      The perianal and digital rectal examinations were normal. Pertinent       negatives include normal sphincter tone and no palpable rectal lesions.      Non-bleeding internal hemorrhoids were found during retroflexion. The       hemorrhoids were Grade I (internal hemorrhoids that do not prolapse).      A few medium-mouthed diverticula were found in the sigmoid colon.      The exam was otherwise without abnormality. Impression:            - Non-bleeding internal hemorrhoids.                        - Diverticulosis in the sigmoid colon.                        - The examination was otherwise normal.                        - No specimens collected. Recommendation:        - Patient has a contact number available for                         emergencies. The signs and symptoms of potential                         delayed complications were discussed with the patient.                         Return to normal activities tomorrow. Written  discharge instructions were provided to the patient.                        - Resume previous diet.                        - Continue present medications.                        - You do NOT require further colon cancer screening                         measures (Annual stool testing (i.e. hemoccult, FIT,                         cologuard), sigmoidoscopy, colonoscopy or CT                         colonography). You should share this recommendation                         with your Primary Care provider.                         - Return to GI office PRN.                        - The findings and recommendations were discussed with                         the patient. Procedure Code(s):     --- Professional ---                        T0160, Colorectal cancer screening; colonoscopy on                         individual at high risk Diagnosis Code(s):     --- Professional ---                        K57.30, Diverticulosis of large intestine without                         perforation or abscess without bleeding                        K64.0, First degree hemorrhoids                        Z80.0, Family history of malignant neoplasm of                         digestive organs CPT copyright 2019 American Medical Association. All rights reserved. The codes documented in this report are preliminary and upon coder review may  be revised to meet current compliance requirements. Efrain Sella MD, MD 02/19/2022 1:57:57 PM This report has been signed electronically. Number of Addenda: 0 Note Initiated On: 02/19/2022 1:33 PM Scope Withdrawal Time: 0 hours 4 minutes 30 seconds  Total Procedure Duration: 0 hours 8 minutes 1 second  Estimated Blood Loss:  Estimated blood loss: none.  [facility]

## 2022-02-19 NOTE — Anesthesia Procedure Notes (Signed)
Date/Time: 02/19/2022 2:01 PM  Performed by: Nelda Marseille, CRNAPre-anesthesia Checklist: Patient identified, Emergency Drugs available, Suction available, Patient being monitored and Timeout performed Oxygen Delivery Method: Nasal cannula

## 2022-02-21 ENCOUNTER — Encounter: Payer: Self-pay | Admitting: Internal Medicine

## 2022-02-28 DIAGNOSIS — R197 Diarrhea, unspecified: Secondary | ICD-10-CM | POA: Diagnosis not present

## 2022-04-11 DIAGNOSIS — M5030 Other cervical disc degeneration, unspecified cervical region: Secondary | ICD-10-CM | POA: Diagnosis not present

## 2022-04-11 DIAGNOSIS — M810 Age-related osteoporosis without current pathological fracture: Secondary | ICD-10-CM | POA: Diagnosis not present

## 2022-04-11 DIAGNOSIS — E785 Hyperlipidemia, unspecified: Secondary | ICD-10-CM | POA: Diagnosis not present

## 2022-04-11 DIAGNOSIS — I10 Essential (primary) hypertension: Secondary | ICD-10-CM | POA: Diagnosis not present

## 2022-04-11 DIAGNOSIS — Z9849 Cataract extraction status, unspecified eye: Secondary | ICD-10-CM | POA: Diagnosis not present

## 2022-04-11 DIAGNOSIS — M1990 Unspecified osteoarthritis, unspecified site: Secondary | ICD-10-CM | POA: Diagnosis not present

## 2022-04-11 DIAGNOSIS — G8929 Other chronic pain: Secondary | ICD-10-CM | POA: Diagnosis not present

## 2022-04-11 DIAGNOSIS — K219 Gastro-esophageal reflux disease without esophagitis: Secondary | ICD-10-CM | POA: Diagnosis not present

## 2022-04-11 DIAGNOSIS — M5136 Other intervertebral disc degeneration, lumbar region: Secondary | ICD-10-CM | POA: Diagnosis not present

## 2022-07-09 DIAGNOSIS — R7302 Impaired glucose tolerance (oral): Secondary | ICD-10-CM | POA: Diagnosis not present

## 2022-07-09 DIAGNOSIS — M545 Low back pain, unspecified: Secondary | ICD-10-CM | POA: Diagnosis not present

## 2022-07-09 DIAGNOSIS — Z1231 Encounter for screening mammogram for malignant neoplasm of breast: Secondary | ICD-10-CM | POA: Diagnosis not present

## 2022-07-09 DIAGNOSIS — E782 Mixed hyperlipidemia: Secondary | ICD-10-CM | POA: Diagnosis not present

## 2022-07-09 DIAGNOSIS — Z13820 Encounter for screening for osteoporosis: Secondary | ICD-10-CM | POA: Diagnosis not present

## 2022-07-09 DIAGNOSIS — M546 Pain in thoracic spine: Secondary | ICD-10-CM | POA: Diagnosis not present

## 2022-07-09 DIAGNOSIS — F1190 Opioid use, unspecified, uncomplicated: Secondary | ICD-10-CM | POA: Diagnosis not present

## 2022-07-09 DIAGNOSIS — Z79899 Other long term (current) drug therapy: Secondary | ICD-10-CM | POA: Diagnosis not present

## 2022-07-09 DIAGNOSIS — Z0000 Encounter for general adult medical examination without abnormal findings: Secondary | ICD-10-CM | POA: Diagnosis not present

## 2022-07-09 DIAGNOSIS — G8929 Other chronic pain: Secondary | ICD-10-CM | POA: Diagnosis not present

## 2022-07-09 DIAGNOSIS — N183 Chronic kidney disease, stage 3 unspecified: Secondary | ICD-10-CM | POA: Diagnosis not present

## 2022-07-09 DIAGNOSIS — K219 Gastro-esophageal reflux disease without esophagitis: Secondary | ICD-10-CM | POA: Diagnosis not present

## 2022-07-10 ENCOUNTER — Other Ambulatory Visit: Payer: Self-pay | Admitting: Nurse Practitioner

## 2022-07-10 DIAGNOSIS — Z1231 Encounter for screening mammogram for malignant neoplasm of breast: Secondary | ICD-10-CM

## 2022-07-17 ENCOUNTER — Ambulatory Visit
Admission: RE | Admit: 2022-07-17 | Discharge: 2022-07-17 | Disposition: A | Discharge status: Home / Self Care | Payer: PPO | Source: Ambulatory Visit | Attending: Nurse Practitioner | Admitting: Nurse Practitioner

## 2022-07-17 DIAGNOSIS — Z1231 Encounter for screening mammogram for malignant neoplasm of breast: Secondary | ICD-10-CM | POA: Diagnosis not present

## 2022-07-17 DIAGNOSIS — M8588 Other specified disorders of bone density and structure, other site: Secondary | ICD-10-CM | POA: Diagnosis not present

## 2022-08-01 DIAGNOSIS — H43813 Vitreous degeneration, bilateral: Secondary | ICD-10-CM | POA: Diagnosis not present

## 2022-08-01 DIAGNOSIS — H40003 Preglaucoma, unspecified, bilateral: Secondary | ICD-10-CM | POA: Diagnosis not present

## 2022-08-01 DIAGNOSIS — H35372 Puckering of macula, left eye: Secondary | ICD-10-CM | POA: Diagnosis not present

## 2022-11-03 DIAGNOSIS — J069 Acute upper respiratory infection, unspecified: Secondary | ICD-10-CM | POA: Diagnosis not present

## 2023-01-07 DIAGNOSIS — M545 Low back pain, unspecified: Secondary | ICD-10-CM | POA: Diagnosis not present

## 2023-01-07 DIAGNOSIS — N183 Chronic kidney disease, stage 3 unspecified: Secondary | ICD-10-CM | POA: Diagnosis not present

## 2023-01-07 DIAGNOSIS — F1190 Opioid use, unspecified, uncomplicated: Secondary | ICD-10-CM | POA: Diagnosis not present

## 2023-01-07 DIAGNOSIS — G8929 Other chronic pain: Secondary | ICD-10-CM | POA: Diagnosis not present

## 2023-01-07 DIAGNOSIS — R7302 Impaired glucose tolerance (oral): Secondary | ICD-10-CM | POA: Diagnosis not present

## 2023-01-07 DIAGNOSIS — E782 Mixed hyperlipidemia: Secondary | ICD-10-CM | POA: Diagnosis not present

## 2023-01-07 DIAGNOSIS — K219 Gastro-esophageal reflux disease without esophagitis: Secondary | ICD-10-CM | POA: Diagnosis not present

## 2023-01-07 DIAGNOSIS — M546 Pain in thoracic spine: Secondary | ICD-10-CM | POA: Diagnosis not present

## 2023-01-07 DIAGNOSIS — Z79899 Other long term (current) drug therapy: Secondary | ICD-10-CM | POA: Diagnosis not present

## 2023-03-30 DIAGNOSIS — D2371 Other benign neoplasm of skin of right lower limb, including hip: Secondary | ICD-10-CM | POA: Diagnosis not present

## 2023-03-30 DIAGNOSIS — M2041 Other hammer toe(s) (acquired), right foot: Secondary | ICD-10-CM | POA: Diagnosis not present

## 2023-05-12 DIAGNOSIS — M1990 Unspecified osteoarthritis, unspecified site: Secondary | ICD-10-CM | POA: Diagnosis not present

## 2023-05-12 DIAGNOSIS — G629 Polyneuropathy, unspecified: Secondary | ICD-10-CM | POA: Diagnosis not present

## 2023-05-12 DIAGNOSIS — G8929 Other chronic pain: Secondary | ICD-10-CM | POA: Diagnosis not present

## 2023-05-12 DIAGNOSIS — M5136 Other intervertebral disc degeneration, lumbar region: Secondary | ICD-10-CM | POA: Diagnosis not present

## 2023-05-12 DIAGNOSIS — N183 Chronic kidney disease, stage 3 unspecified: Secondary | ICD-10-CM | POA: Diagnosis not present

## 2023-05-12 DIAGNOSIS — M5030 Other cervical disc degeneration, unspecified cervical region: Secondary | ICD-10-CM | POA: Diagnosis not present

## 2023-05-12 DIAGNOSIS — E785 Hyperlipidemia, unspecified: Secondary | ICD-10-CM | POA: Diagnosis not present

## 2023-05-12 DIAGNOSIS — Z9849 Cataract extraction status, unspecified eye: Secondary | ICD-10-CM | POA: Diagnosis not present

## 2023-05-12 DIAGNOSIS — I10 Essential (primary) hypertension: Secondary | ICD-10-CM | POA: Diagnosis not present

## 2023-05-12 DIAGNOSIS — I129 Hypertensive chronic kidney disease with stage 1 through stage 4 chronic kidney disease, or unspecified chronic kidney disease: Secondary | ICD-10-CM | POA: Diagnosis not present

## 2023-05-12 DIAGNOSIS — M810 Age-related osteoporosis without current pathological fracture: Secondary | ICD-10-CM | POA: Diagnosis not present

## 2023-07-23 ENCOUNTER — Other Ambulatory Visit: Payer: Self-pay | Admitting: Nurse Practitioner

## 2023-07-23 DIAGNOSIS — M545 Low back pain, unspecified: Secondary | ICD-10-CM | POA: Diagnosis not present

## 2023-07-23 DIAGNOSIS — R030 Elevated blood-pressure reading, without diagnosis of hypertension: Secondary | ICD-10-CM | POA: Diagnosis not present

## 2023-07-23 DIAGNOSIS — Z1231 Encounter for screening mammogram for malignant neoplasm of breast: Secondary | ICD-10-CM

## 2023-07-23 DIAGNOSIS — Z79899 Other long term (current) drug therapy: Secondary | ICD-10-CM | POA: Diagnosis not present

## 2023-07-23 DIAGNOSIS — M25562 Pain in left knee: Secondary | ICD-10-CM | POA: Diagnosis not present

## 2023-07-23 DIAGNOSIS — G8929 Other chronic pain: Secondary | ICD-10-CM | POA: Diagnosis not present

## 2023-07-23 DIAGNOSIS — E782 Mixed hyperlipidemia: Secondary | ICD-10-CM | POA: Diagnosis not present

## 2023-07-23 DIAGNOSIS — F1190 Opioid use, unspecified, uncomplicated: Secondary | ICD-10-CM | POA: Diagnosis not present

## 2023-07-23 DIAGNOSIS — R7302 Impaired glucose tolerance (oral): Secondary | ICD-10-CM | POA: Diagnosis not present

## 2023-07-23 DIAGNOSIS — Z0000 Encounter for general adult medical examination without abnormal findings: Secondary | ICD-10-CM | POA: Diagnosis not present

## 2023-07-23 DIAGNOSIS — Z1331 Encounter for screening for depression: Secondary | ICD-10-CM | POA: Diagnosis not present

## 2023-07-23 DIAGNOSIS — M25561 Pain in right knee: Secondary | ICD-10-CM | POA: Diagnosis not present

## 2023-07-23 DIAGNOSIS — M546 Pain in thoracic spine: Secondary | ICD-10-CM | POA: Diagnosis not present

## 2023-08-03 DIAGNOSIS — M25561 Pain in right knee: Secondary | ICD-10-CM | POA: Diagnosis not present

## 2023-08-03 DIAGNOSIS — M25562 Pain in left knee: Secondary | ICD-10-CM | POA: Diagnosis not present

## 2023-08-03 DIAGNOSIS — M170 Bilateral primary osteoarthritis of knee: Secondary | ICD-10-CM | POA: Diagnosis not present

## 2023-08-07 DIAGNOSIS — H40003 Preglaucoma, unspecified, bilateral: Secondary | ICD-10-CM | POA: Diagnosis not present

## 2023-08-07 DIAGNOSIS — M3501 Sicca syndrome with keratoconjunctivitis: Secondary | ICD-10-CM | POA: Diagnosis not present

## 2023-08-07 DIAGNOSIS — Z961 Presence of intraocular lens: Secondary | ICD-10-CM | POA: Diagnosis not present

## 2023-08-07 DIAGNOSIS — H35372 Puckering of macula, left eye: Secondary | ICD-10-CM | POA: Diagnosis not present

## 2023-08-12 ENCOUNTER — Ambulatory Visit
Admission: RE | Admit: 2023-08-12 | Discharge: 2023-08-12 | Disposition: A | Discharge status: Home / Self Care | Payer: PPO | Source: Ambulatory Visit | Attending: Nurse Practitioner | Admitting: Nurse Practitioner

## 2023-08-12 DIAGNOSIS — Z1231 Encounter for screening mammogram for malignant neoplasm of breast: Secondary | ICD-10-CM | POA: Diagnosis not present

## 2024-01-20 DIAGNOSIS — E782 Mixed hyperlipidemia: Secondary | ICD-10-CM | POA: Diagnosis not present

## 2024-01-20 DIAGNOSIS — Z79899 Other long term (current) drug therapy: Secondary | ICD-10-CM | POA: Diagnosis not present

## 2024-01-20 DIAGNOSIS — R7302 Impaired glucose tolerance (oral): Secondary | ICD-10-CM | POA: Diagnosis not present

## 2024-01-20 DIAGNOSIS — K219 Gastro-esophageal reflux disease without esophagitis: Secondary | ICD-10-CM | POA: Diagnosis not present

## 2024-01-20 DIAGNOSIS — N183 Chronic kidney disease, stage 3 unspecified: Secondary | ICD-10-CM | POA: Diagnosis not present

## 2024-01-20 DIAGNOSIS — Z1331 Encounter for screening for depression: Secondary | ICD-10-CM | POA: Diagnosis not present

## 2024-01-20 DIAGNOSIS — F1190 Opioid use, unspecified, uncomplicated: Secondary | ICD-10-CM | POA: Diagnosis not present

## 2024-01-20 DIAGNOSIS — G8929 Other chronic pain: Secondary | ICD-10-CM | POA: Diagnosis not present

## 2024-01-20 DIAGNOSIS — M545 Low back pain, unspecified: Secondary | ICD-10-CM | POA: Diagnosis not present

## 2024-01-20 DIAGNOSIS — M546 Pain in thoracic spine: Secondary | ICD-10-CM | POA: Diagnosis not present

## 2024-05-18 DIAGNOSIS — M170 Bilateral primary osteoarthritis of knee: Secondary | ICD-10-CM | POA: Diagnosis not present

## 2024-07-22 DIAGNOSIS — Z79899 Other long term (current) drug therapy: Secondary | ICD-10-CM | POA: Diagnosis not present

## 2024-07-22 DIAGNOSIS — R7302 Impaired glucose tolerance (oral): Secondary | ICD-10-CM | POA: Diagnosis not present

## 2024-07-22 DIAGNOSIS — E782 Mixed hyperlipidemia: Secondary | ICD-10-CM | POA: Diagnosis not present

## 2024-07-22 DIAGNOSIS — Z23 Encounter for immunization: Secondary | ICD-10-CM | POA: Diagnosis not present

## 2024-07-22 DIAGNOSIS — M545 Low back pain, unspecified: Secondary | ICD-10-CM | POA: Diagnosis not present

## 2024-07-22 DIAGNOSIS — M546 Pain in thoracic spine: Secondary | ICD-10-CM | POA: Diagnosis not present

## 2024-07-22 DIAGNOSIS — Z0000 Encounter for general adult medical examination without abnormal findings: Secondary | ICD-10-CM | POA: Diagnosis not present

## 2024-07-22 DIAGNOSIS — F1190 Opioid use, unspecified, uncomplicated: Secondary | ICD-10-CM | POA: Diagnosis not present

## 2024-07-22 DIAGNOSIS — Z1331 Encounter for screening for depression: Secondary | ICD-10-CM | POA: Diagnosis not present

## 2024-07-22 DIAGNOSIS — G8929 Other chronic pain: Secondary | ICD-10-CM | POA: Diagnosis not present

## 2024-07-22 DIAGNOSIS — K219 Gastro-esophageal reflux disease without esophagitis: Secondary | ICD-10-CM | POA: Diagnosis not present

## 2024-07-25 ENCOUNTER — Other Ambulatory Visit: Payer: Self-pay | Admitting: Nurse Practitioner

## 2024-07-25 DIAGNOSIS — Z1231 Encounter for screening mammogram for malignant neoplasm of breast: Secondary | ICD-10-CM

## 2024-08-23 ENCOUNTER — Ambulatory Visit
Admission: RE | Admit: 2024-08-23 | Discharge: 2024-08-23 | Disposition: A | Discharge status: Home / Self Care | Source: Ambulatory Visit | Attending: Nurse Practitioner | Admitting: Nurse Practitioner

## 2024-08-23 DIAGNOSIS — Z1231 Encounter for screening mammogram for malignant neoplasm of breast: Secondary | ICD-10-CM | POA: Diagnosis present
